# Patient Record
Sex: Female | Born: 1990 | Race: White | Hispanic: No | Marital: Married | Smoking: Never smoker
Health system: Southern US, Community
[De-identification: ages and names within clinical notes are randomized; demographics above are authoritative.]

## PROBLEM LIST (undated history)

## (undated) DIAGNOSIS — O039 Complete or unspecified spontaneous abortion without complication: Secondary | ICD-10-CM

## (undated) HISTORY — DX: Complete or unspecified spontaneous abortion without complication: O03.9

---

## 2019-05-17 DIAGNOSIS — Z8759 Personal history of other complications of pregnancy, childbirth and the puerperium: Secondary | ICD-10-CM | POA: Insufficient documentation

## 2020-12-27 ENCOUNTER — Emergency Department
Admission: EM | Admit: 2020-12-27 | Discharge: 2020-12-27 | Disposition: A | Discharge status: Home / Self Care | Payer: Managed Care, Other (non HMO) | Attending: Emergency Medicine | Admitting: Emergency Medicine

## 2020-12-27 ENCOUNTER — Emergency Department: Payer: Managed Care, Other (non HMO)

## 2020-12-27 ENCOUNTER — Encounter: Payer: Self-pay | Admitting: *Deleted

## 2020-12-27 ENCOUNTER — Other Ambulatory Visit: Payer: Self-pay

## 2020-12-27 DIAGNOSIS — O4691 Antepartum hemorrhage, unspecified, first trimester: Secondary | ICD-10-CM | POA: Insufficient documentation

## 2020-12-27 DIAGNOSIS — Z3A11 11 weeks gestation of pregnancy: Secondary | ICD-10-CM | POA: Diagnosis not present

## 2020-12-27 DIAGNOSIS — O209 Hemorrhage in early pregnancy, unspecified: Secondary | ICD-10-CM

## 2020-12-27 DIAGNOSIS — N939 Abnormal uterine and vaginal bleeding, unspecified: Secondary | ICD-10-CM

## 2020-12-27 DIAGNOSIS — O0289 Other abnormal products of conception: Secondary | ICD-10-CM

## 2020-12-27 DIAGNOSIS — O468X1 Other antepartum hemorrhage, first trimester: Secondary | ICD-10-CM

## 2020-12-27 DIAGNOSIS — O418X10 Other specified disorders of amniotic fluid and membranes, first trimester, not applicable or unspecified: Secondary | ICD-10-CM

## 2020-12-27 LAB — HCG, QUANTITATIVE, PREGNANCY: hCG, Beta Chain, Quant, S: 23511 m[IU]/mL — ABNORMAL HIGH (ref <5)

## 2020-12-27 LAB — CBC WITH DIFFERENTIAL/PLATELET
Abs Immature Granulocytes: 0.05 10*3/uL (ref 0.00–0.07)
Basophils Absolute: 0.1 10*3/uL (ref 0.0–0.1)
Basophils Relative: 1 %
Eosinophils Absolute: 0.1 10*3/uL (ref 0.0–0.5)
Eosinophils Relative: 1 %
HCT: 36.2 % (ref 36.0–46.0)
Hemoglobin: 12.4 g/dL (ref 12.0–15.0)
Immature Granulocytes: 0 %
Lymphocytes Relative: 21 %
Lymphs Abs: 2.4 10*3/uL (ref 0.7–4.0)
MCH: 30.5 pg (ref 26.0–34.0)
MCHC: 34.3 g/dL (ref 30.0–36.0)
MCV: 89.2 fL (ref 80.0–100.0)
Monocytes Absolute: 0.8 10*3/uL (ref 0.1–1.0)
Monocytes Relative: 7 %
Neutro Abs: 7.9 10*3/uL — ABNORMAL HIGH (ref 1.7–7.7)
Neutrophils Relative %: 70 %
Platelets: 282 10*3/uL (ref 150–400)
RBC: 4.06 MIL/uL (ref 3.87–5.11)
RDW: 12.8 % (ref 11.5–15.5)
WBC: 11.3 10*3/uL — ABNORMAL HIGH (ref 4.0–10.5)
nRBC: 0 % (ref 0.0–0.2)

## 2020-12-27 LAB — HEMOGLOBIN AND HEMATOCRIT, BLOOD
HCT: 35.5 % — ABNORMAL LOW (ref 36.0–46.0)
Hemoglobin: 12.4 g/dL (ref 12.0–15.0)

## 2020-12-27 LAB — BASIC METABOLIC PANEL
Anion gap: 7 (ref 5–15)
BUN: 11 mg/dL (ref 6–20)
CO2: 24 mmol/L (ref 22–32)
Calcium: 9.1 mg/dL (ref 8.9–10.3)
Chloride: 103 mmol/L (ref 98–111)
Creatinine, Ser: 0.65 mg/dL (ref 0.44–1.00)
GFR, Estimated: >60 mL/min (ref >60)
Glucose, Bld: 105 mg/dL — ABNORMAL HIGH (ref 70–99)
Potassium: 4.2 mmol/L (ref 3.5–5.1)
Sodium: 134 mmol/L — ABNORMAL LOW (ref 135–145)

## 2020-12-27 LAB — URINALYSIS, COMPLETE (UACMP) WITH MICROSCOPIC
Bilirubin Urine: NEGATIVE
Glucose, UA: NEGATIVE mg/dL
Ketones, ur: 5 mg/dL — AB
Nitrite: NEGATIVE
Protein, ur: 30 mg/dL — AB
RBC / HPF: >50 RBC/hpf — ABNORMAL HIGH (ref 0–5)
Specific Gravity, Urine: 1.008 (ref 1.005–1.030)
WBC, UA: >50 WBC/hpf — ABNORMAL HIGH (ref 0–5)
pH: 6 (ref 5.0–8.0)

## 2020-12-27 LAB — WET PREP, GENITAL
Clue Cells Wet Prep HPF POC: NONE SEEN
Sperm: NONE SEEN
Trich, Wet Prep: NONE SEEN
Yeast Wet Prep HPF POC: NONE SEEN

## 2020-12-27 NOTE — ED Provider Notes (Signed)
[facility] Emergency Department Provider Note  ____________________________________________   Event Date/Time   First MD Initiated Contact with Patient 12/27/20 1951     (approximate)  I have reviewed the triage vital signs and the nursing notes.   HISTORY  Chief Complaint Vaginal Bleeding   HPI Michelle Holden is a 30 y.o. female G2, P1 at approximately 11 weeks by last LMP who presents with concerns that she is having a miscarriage after being told she likely had a nonviable pregnancy at her last OB visit on 10/28.  She states that 5 days ago she developed some vaginal bleeding that seems to have picked up today.  She has been going through several pads.  She states she has had some generalized crampy abdominal pain last couple days as well but is not very severe right now and does not wish for any analgesia.  She denies any back pain, trauma, injuries, burning with urination, other abnormal vaginal discharge, chest pain, cough, shortness of breath, fevers, headache, earache, sore throat, lightheadedness or dizziness or any other acute sick symptoms.  States only medication she had been on was prenatal vitamins.  She states she had ultrasound done on 10/28 at her last visit that showed fetal size was very small and they were told likely nonviable.         No past medical history on file.  There are no problems to display for this patient.     Prior to Admission medications   Not on File    Allergies Patient has no known allergies.  No family history on file.  Social History Social History   Tobacco Use   Smoking status: Never   Smokeless tobacco: Never  Substance Use Topics   Alcohol use: Not Currently    Review of Systems  Review of Systems  Constitutional:  Negative for chills and fever.  HENT:  Negative for sore throat.   Eyes:  Negative for pain.  Respiratory:  Negative for cough and stridor.   Cardiovascular:  Negative for  chest pain.  Gastrointestinal:  Positive for abdominal pain. Negative for vomiting.  Genitourinary:  Negative for dysuria.  Musculoskeletal:  Negative for myalgias.  Skin:  Negative for rash.  Neurological:  Negative for seizures, loss of consciousness and headaches.  Psychiatric/Behavioral:  Negative for suicidal ideas.   All other systems reviewed and are negative.    ____________________________________________   PHYSICAL EXAM:  VITAL SIGNS: ED Triage Vitals  Enc Vitals Group     BP 12/27/20 1852 (!) 137/91     Pulse Rate 12/27/20 1852 81     Resp 12/27/20 1852 20     Temp 12/27/20 1852 99.3 F (37.4 C)     Temp Source 12/27/20 1852 Oral     SpO2 12/27/20 1852 100 %     Weight 12/27/20 1848 173 lb (78.5 kg)     Height 12/27/20 1848 5\' 6"  (1.676 m)     Head Circumference --      Peak Flow --      Pain Score 12/27/20 1847 0     Pain Loc --      Pain Edu? --      Excl. in GC? --    Vitals:   12/27/20 1852  BP: (!) 137/91  Pulse: 81  Resp: 20  Temp: 99.3 F (37.4 C)  SpO2: 100%   Physical Exam Vitals and nursing note reviewed.  Constitutional:      General: She is  not in acute distress.    Appearance: She is well-developed.  HENT:     Head: Normocephalic and atraumatic.     Right Ear: External ear normal.     Left Ear: External ear normal.     Nose: Nose normal.  Eyes:     Conjunctiva/sclera: Conjunctivae normal.  Cardiovascular:     Rate and Rhythm: Normal rate and regular rhythm.     Heart sounds: No murmur heard. Pulmonary:     Effort: Pulmonary effort is normal. No respiratory distress.     Breath sounds: Normal breath sounds.  Abdominal:     Palpations: Abdomen is soft.     Tenderness: There is no abdominal tenderness.  Musculoskeletal:     Cervical back: Neck supple.  Skin:    General: Skin is warm and dry.     Capillary Refill: Capillary refill takes less than 2 seconds.  Neurological:     Mental Status: She is alert and oriented to person,  place, and time.  Psychiatric:        Mood and Affect: Mood normal.    Pelvic exam shows slightly edematous closed cervix with some minimal active oozing of red blood.  There is no friability or purulence. ____________________________________________   LABS (all labs ordered are listed, but only abnormal results are displayed)  Labs Reviewed  CBC WITH DIFFERENTIAL/PLATELET - Abnormal; Notable for the following components:      Result Value   WBC 11.3 (*)    Neutro Abs 7.9 (*)    All other components within normal limits  BASIC METABOLIC PANEL - Abnormal; Notable for the following components:   Sodium 134 (*)    Glucose, Bld 105 (*)    All other components within normal limits  HCG, QUANTITATIVE, PREGNANCY - Abnormal; Notable for the following components:   hCG, Beta Chain, Quant, S 23,511 (*)    All other components within normal limits  WET PREP, GENITAL  CHLAMYDIA/NGC RT PCR ([facility] ONLY)            HEMOGLOBIN AND HEMATOCRIT, BLOOD  URINALYSIS, COMPLETE (UACMP) WITH MICROSCOPIC   ____________________________________________  EKG  ____________________________________________  RADIOLOGY  ED MD interpretation: Pelvic OB ultrasound shows an IUP without cardiac activity measuring 6 weeks by size.  Findings are most consistent with likely failed but not definitive early pregnancy.  There is no evidence of hemorrhage, free fluid on the left ovary is not completely visualized right ovary is unremarkable.  There is a very small subchronic hemorrhage.  No other acute abnormalities noted.  Official radiology report(s): US OB LESS THAN 14 WEEKS WITH OB TRANSVAGINAL  Result Date: 12/27/2020 CLINICAL DATA:  Pregnant, vaginal bleeding EXAM: OBSTETRIC <14 WK [place] AND TRANSVAGINAL OB US TECHNIQUE: Both transabdominal and transvaginal ultrasound examinations were performed for complete evaluation of the gestation as well as the maternal uterus, adnexal regions, and pelvic cul-de-sac.  Transvaginal technique was performed to assess early pregnancy. COMPARISON:  None. FINDINGS: Intrauterine gestational sac: Single, mildly irregular with angular margins Yolk sac:  Not Visualized. Embryo:  Visualized. Cardiac Activity: Not Visualized. CRL:  3.6 mm   6 w   0 d Subchorionic hemorrhage:  Small subchronic hemorrhage. Maternal uterus/adnexae: Right ovary is within normal limits, noting a corpus luteum. Left ovary is not discretely visualized. No free fluid. IMPRESSION: Single IUP without cardiac activity, measuring 6 weeks 0 days by crown-rump length. Findings are suspicious but not yet definitive for failed pregnancy. Recommend follow-up US in 10-14 days for definitive diagnosis.  This recommendation follows SRU consensus guidelines: Diagnostic Criteria for Nonviable Pregnancy Early in the First Trimester. Alta Corning Med 2013WM:[id number]. Electronically Signed   By: Julian Hy M.D.   On: 12/27/2020 21:23    ____________________________________________   PROCEDURES  Procedure(s) performed (including Critical Care):  Procedures   ____________________________________________   INITIAL IMPRESSION / ASSESSMENT AND PLAN / ED COURSE      Patient presents with above-stated history and exam for assessment of approximately 5 days of some crampy abdominal pain associate with vaginal bleeding that she feels picked up a little more today.  This is in the setting of recent being told she likely had a nonviable pregnancy and patient being under impression that she is having a miscarriage.  She states she told the on-call nurse that her bleeding was little more today and was told to come to the emergency room.  She states he has minimal pain on arrival and does not wish for any pain medicines.  She is afebrile and hemodynamically stable on arrival.  Her abdomen is soft nontender throughout and she has no CVA tenderness.  It is certainly possible she has an incomplete abortion although given she  has not had any imaging since her bleeding began additional differential considerations include possible subchorionic hemorrhage.  She has no fever or significant pain and does not appear toxic and have a lower suspicion for acute infectious process i.e. endometritis.  CBC shows very mild leukocytosis with WBC count of 11.3 without any other acute derangements.  BMP shows no significant electrolyte or metabolic derangements.  hCG is elevated at 23,511.  Pelvic OB ultrasound shows an IUP without cardiac activity measuring 6 weeks by size.  Findings are most consistent with likely failed but not definitive early pregnancy.  There is no evidence of hemorrhage, free fluid on the left ovary is not completely visualized right ovary is unremarkable.  There is a very small subchronic hemorrhage.  No other acute abnormalities noted.  Exam shows a closed os with some oozing without any other trauma or evidence of infection.  Suspect likely threatened versus early miscarriage from likely nonviable pregnancy.  I discussed this with patient.  We will plan to check hemoglobin again and if this is stable given stable vitals with eyes reassuring exam I think she will be stable for discharge with close outpatient follow-up.  If there is a significant drop greater than 2 g of her hemoglobin I think will be reasonable to have her admitted for more urgent D&C.  I discussed this plan with on-call obstetrician Dr. Kenton Kingfisher who agreed with this.  Care patient signed over to assuming provider at approximately 10 PM with follow-up to follow-up repeat hemoglobin and reassess.  Patient does not appear actively infected wet prep and GC's were sent from pelvic exam.  Do not think patient needs to remain in the emergency room she is otherwise stable for discharge for pending GC.      ____________________________________________   FINAL CLINICAL IMPRESSION(S) / ED DIAGNOSES  Final diagnoses:  Subchorionic hemorrhage of placenta  in first trimester, single or unspecified fetus  First trimester bleeding  Non-viable pregnancy    Medications - No data to display   ED Discharge Orders     None        Note:  This document was prepared using Dragon voice recognition software and may include unintentional dictation errors.    Lucrezia Starch, MD 12/27/20 2154

## 2020-12-27 NOTE — Discharge Instructions (Addendum)
Your Ultrasound today showed: FINDINGS: Intrauterine gestational sac: Single, mildly irregular with angular margins   Yolk sac:  Not Visualized.   Embryo:  Visualized.   Cardiac Activity: Not Visualized.   CRL:  3.6 mm   6 w   0 d   Subchorionic hemorrhage:  Small subchronic hemorrhage.   Maternal uterus/adnexae: Right ovary is within normal limits, noting a corpus luteum.   Left ovary is not discretely visualized.   No free fluid.   IMPRESSION: Single IUP without cardiac activity, measuring 6 weeks 0 days by crown-rump length.   Findings are suspicious but not yet definitive for failed pregnancy. Recommend follow-up US in 10-14 days for definitive diagnosis. This recommendation follows SRU consensus guidelines: Diagnostic Criteria for Nonviable Pregnancy Early in the First Trimester. Malva Limes Med 2013; 887:1959-74.

## 2020-12-27 NOTE — ED Notes (Signed)
Pt unable to void at this time. 

## 2020-12-27 NOTE — ED Triage Notes (Signed)
Pt reports vag bleeding for 5 days.  Pt is approx [redacted] weeks pregnant.  Pt treated at [facility].  Pt has low back pain.   Pt alert  speech clear.  g2p1a0

## 2020-12-28 ENCOUNTER — Encounter: Payer: Self-pay | Admitting: Emergency Medicine

## 2020-12-28 ENCOUNTER — Emergency Department: Payer: Managed Care, Other (non HMO)

## 2020-12-28 ENCOUNTER — Ambulatory Visit
Admission: EM | Admit: 2020-12-28 | Discharge: 2020-12-29 | Disposition: A | Discharge status: Home / Self Care | Payer: Managed Care, Other (non HMO) | Attending: Emergency Medicine | Admitting: Emergency Medicine

## 2020-12-28 DIAGNOSIS — O418X10 Other specified disorders of amniotic fluid and membranes, first trimester, not applicable or unspecified: Secondary | ICD-10-CM | POA: Diagnosis present

## 2020-12-28 DIAGNOSIS — N939 Abnormal uterine and vaginal bleeding, unspecified: Secondary | ICD-10-CM | POA: Diagnosis present

## 2020-12-28 DIAGNOSIS — O021 Missed abortion: Secondary | ICD-10-CM

## 2020-12-28 DIAGNOSIS — Z3A11 11 weeks gestation of pregnancy: Secondary | ICD-10-CM | POA: Diagnosis not present

## 2020-12-28 DIAGNOSIS — Z20822 Contact with and (suspected) exposure to covid-19: Secondary | ICD-10-CM | POA: Diagnosis not present

## 2020-12-28 DIAGNOSIS — O468X1 Other antepartum hemorrhage, first trimester: Secondary | ICD-10-CM

## 2020-12-28 LAB — CBC WITH DIFFERENTIAL/PLATELET
Abs Immature Granulocytes: 0.04 10*3/uL (ref 0.00–0.07)
Basophils Absolute: 0.1 10*3/uL (ref 0.0–0.1)
Basophils Relative: 1 %
Eosinophils Absolute: 0.1 10*3/uL (ref 0.0–0.5)
Eosinophils Relative: 1 %
HCT: 34.3 % — ABNORMAL LOW (ref 36.0–46.0)
Hemoglobin: 11.7 g/dL — ABNORMAL LOW (ref 12.0–15.0)
Immature Granulocytes: 0 %
Lymphocytes Relative: 19 %
Lymphs Abs: 1.9 10*3/uL (ref 0.7–4.0)
MCH: 30.5 pg (ref 26.0–34.0)
MCHC: 34.1 g/dL (ref 30.0–36.0)
MCV: 89.6 fL (ref 80.0–100.0)
Monocytes Absolute: 0.6 10*3/uL (ref 0.1–1.0)
Monocytes Relative: 6 %
Neutro Abs: 7.4 10*3/uL (ref 1.7–7.7)
Neutrophils Relative %: 73 %
Platelets: 262 10*3/uL (ref 150–400)
RBC: 3.83 MIL/uL — ABNORMAL LOW (ref 3.87–5.11)
RDW: 12.9 % (ref 11.5–15.5)
WBC: 10 10*3/uL (ref 4.0–10.5)
nRBC: 0 % (ref 0.0–0.2)

## 2020-12-28 LAB — COMPREHENSIVE METABOLIC PANEL
ALT: 14 U/L (ref 0–44)
AST: 17 U/L (ref 15–41)
Albumin: 4 g/dL (ref 3.5–5.0)
Alkaline Phosphatase: 48 U/L (ref 38–126)
Anion gap: 6 (ref 5–15)
BUN: 8 mg/dL (ref 6–20)
CO2: 23 mmol/L (ref 22–32)
Calcium: 8.7 mg/dL — ABNORMAL LOW (ref 8.9–10.3)
Chloride: 106 mmol/L (ref 98–111)
Creatinine, Ser: 0.58 mg/dL (ref 0.44–1.00)
GFR, Estimated: >60 mL/min (ref >60)
Glucose, Bld: 107 mg/dL — ABNORMAL HIGH (ref 70–99)
Potassium: 3.9 mmol/L (ref 3.5–5.1)
Sodium: 135 mmol/L (ref 135–145)
Total Bilirubin: 0.8 mg/dL (ref 0.3–1.2)
Total Protein: 6.8 g/dL (ref 6.5–8.1)

## 2020-12-28 LAB — HCG, QUANTITATIVE, PREGNANCY: hCG, Beta Chain, Quant, S: 18365 m[IU]/mL — ABNORMAL HIGH (ref <5)

## 2020-12-28 LAB — CHLAMYDIA/NGC RT PCR (ARMC ONLY)
Chlamydia Tr: NOT DETECTED
N gonorrhoeae: NOT DETECTED

## 2020-12-28 NOTE — H&P (Signed)
Obstetrics & Gynecology Consult H&P   Chief Complaints: Bleeding   History of Present Illness: Patient is a 30 y.o. G2P1001 at 11 weeks 2 days by last menstrual period of 10/11/2020 (exact date) presenting for second time to ER in past 24-hrs with bleeding and known IUP.  Bleeding and cramping has increased prompting re-presentation.  H&H remains stable.  On initial presentation 12/27/2020 CRL <7.32mm with no FHT so inconclusive on viability.  Today there is a drop in the HCG level from 23,539mIU/mL to 18,667mIU/mL, fetal pole is no longer visualized on ultrasound, and the gestational sac is elongated and has moved into the lower uterine segement.     Review of Systems:10 point review of systems  Past Medical History:  There are no problems to display for this patient.   Past Surgical History:  History reviewed. No pertinent surgical history.  Gynecologic History:   Obstetric History: G1P0  Family History:  No family history on file.  Social History:  Social History   Socioeconomic History   Marital status: Married    Spouse name: Not on file   Number of children: Not on file   Years of education: Not on file   Highest education level: Not on file  Occupational History   Not on file  Tobacco Use   Smoking status: Never   Smokeless tobacco: Never  Substance and Sexual Activity   Alcohol use: Not Currently   Drug use: Not on file   Sexual activity: Not on file  Other Topics Concern   Not on file  Social History Narrative   Not on file   Social Determinants of Health   Financial Resource Strain: Not on file  Food Insecurity: Not on file  Transportation Needs: Not on file  Physical Activity: Not on file  Stress: Not on file  Social Connections: Not on file  Intimate Partner Violence: Not on file    Allergies:  No Known Allergies  Medications: Prior to Admission medications   Not on File    Physical Exam Vitals: Blood pressure 123/83, pulse 76,  temperature 98.2 F (36.8 C), temperature source Oral, resp. rate 18, height 5\' 6"  (1.676 m), weight 78.5 kg, last menstrual period 10/11/2020, SpO2 100 %. General: NAD HEENT: normocephalic, anicteric Pulmonary: No increased work of breathing Cardiovascular: RRR, distal pulses 2+ Abdomen: soft, non-tender Genitourinary: deferred given just had ultrasound prior to me coming down to talk to patient Extremities: no edema, erythema, or tenderness Neurologic: Grossly intact Psychiatric: mood appropriate, affect full  Labs: Results for orders placed or performed during the hospital encounter of 12/28/20 (from the past 72 hour(s))  CBC with Differential     Status: Abnormal   Collection Time: 12/28/20  7:28 PM  Result Value Ref Range   WBC 10.0 4.0 - 10.5 K/uL   RBC 3.83 (L) 3.87 - 5.11 MIL/uL   Hemoglobin 11.7 (L) 12.0 - 15.0 g/dL   HCT 68.1 (L) 15.7 - 26.2 %   MCV 89.6 80.0 - 100.0 fL   MCH 30.5 26.0 - 34.0 pg   MCHC 34.1 30.0 - 36.0 g/dL   RDW 03.5 59.7 - 41.6 %   Platelets 262 150 - 400 K/uL   nRBC 0.0 0.0 - 0.2 %   Neutrophils Relative % 73 %   Neutro Abs 7.4 1.7 - 7.7 K/uL   Lymphocytes Relative 19 %   Lymphs Abs 1.9 0.7 - 4.0 K/uL   Monocytes Relative 6 %   Monocytes Absolute 0.6 0.1 -  1.0 K/uL   Eosinophils Relative 1 %   Eosinophils Absolute 0.1 0.0 - 0.5 K/uL   Basophils Relative 1 %   Basophils Absolute 0.1 0.0 - 0.1 K/uL   Immature Granulocytes 0 %   Abs Immature Granulocytes 0.04 0.00 - 0.07 K/uL    Comment: Performed at [facility], [address] [place], [place] [address]  Comprehensive metabolic panel     Status: Abnormal   Collection Time: 12/28/20  7:28 PM  Result Value Ref Range   Sodium 135 135 - 145 mmol/L   Potassium 3.9 3.5 - 5.1 mmol/L   Chloride 106 98 - 111 mmol/L   CO2 23 22 - 32 mmol/L   Glucose, Bld 107 (H) 70 - 99 mg/dL    Comment: Glucose reference range applies only to samples taken after fasting for at least 8 hours.   BUN 8 6 -  20 mg/dL   Creatinine, Ser 4.68 0.44 - 1.00 mg/dL   Calcium 8.7 (L) 8.9 - 10.3 mg/dL   Total Protein 6.8 6.5 - 8.1 g/dL   Albumin 4.0 3.5 - 5.0 g/dL   AST 17 15 - 41 U/L   ALT 14 0 - 44 U/L   Alkaline Phosphatase 48 38 - 126 U/L   Total Bilirubin 0.8 0.3 - 1.2 mg/dL   GFR, Estimated >03 >21 mL/min    Comment: (NOTE) Calculated using the CKD-EPI Creatinine Equation (2021)    Anion gap 6 5 - 15    Comment: Performed at [facility], [address] [place], [place] [address]  hCG, quantitative, pregnancy     Status: Abnormal   Collection Time: 12/28/20  7:28 PM  Result Value Ref Range   hCG, Beta Chain, Quant, S 18,365 (H) <5 mIU/mL    Comment:          GEST. AGE      CONC.  (mIU/mL)   <=1 WEEK        5 - 50     2 WEEKS       50 - 500     3 WEEKS       100 - 10,000     4 WEEKS     1,000 - 30,000     5 WEEKS     3,500 - 115,000   6-8 WEEKS     12,000 - 270,000    12 WEEKS     15,000 - 220,000        FEMALE AND NON-PREGNANT FEMALE:     LESS THAN 5 mIU/mL Performed at [facility], [address] [place], [place] [address]     Imaging US OB LESS THAN 14 WEEKS WITH OB TRANSVAGINAL  Result Date: 12/28/2020 CLINICAL DATA:  Vaginal bleeding EXAM: OBSTETRIC <14 WK [place] AND TRANSVAGINAL OB US TECHNIQUE: Both transabdominal and transvaginal ultrasound examinations were performed for complete evaluation of the gestation as well as the maternal uterus, adnexal regions, and pelvic cul-de-sac. Transvaginal technique was performed to assess early pregnancy. COMPARISON:  12/27/2020 FINDINGS: Intrauterine gestational sac: Single intrauterine gestational sac now visualized within the lower uterine segment and upper cervix. Yolk sac:  Not visualized Embryo:  Not visualized Cardiac Activity: Not visualized MSD: 17.6 mm mm   6 w   5 d Maternal uterus/adnexae: Ovaries are within normal limits. The left ovary measures 2 x 1 by 1.3 cm. The right ovary measures 3.2 x 10.2 x 4.4 cm.  Moderate complex material within the fundal endometrial canal with  particulate motion. Endometrial thickness measures 2.5 cm and there is increased vascularity. Trace free fluid IMPRESSION: 1. Elongated gestational sac now visualized within the lower uterine segment/upper cervix, consistent with miscarriage in process. Moderate complex material in the fundal endometrium probably represents hemorrhagic material but follow-up sonography may be warranted if continued concern for retained products. 2. Trace free fluid in the pelvis Electronically Signed   By: Jasmine Pang M.D.   On: 12/28/2020 23:21   US OB LESS THAN 14 WEEKS WITH OB TRANSVAGINAL  Result Date: 12/27/2020 CLINICAL DATA:  Pregnant, vaginal bleeding EXAM: OBSTETRIC <14 WK [place] AND TRANSVAGINAL OB US TECHNIQUE: Both transabdominal and transvaginal ultrasound examinations were performed for complete evaluation of the gestation as well as the maternal uterus, adnexal regions, and pelvic cul-de-sac. Transvaginal technique was performed to assess early pregnancy. COMPARISON:  None. FINDINGS: Intrauterine gestational sac: Single, mildly irregular with angular margins Yolk sac:  Not Visualized. Embryo:  Visualized. Cardiac Activity: Not Visualized. CRL:  3.6 mm   6 w   0 d Subchorionic hemorrhage:  Small subchronic hemorrhage. Maternal uterus/adnexae: Right ovary is within normal limits, noting a corpus luteum. Left ovary is not discretely visualized. No free fluid. IMPRESSION: Single IUP without cardiac activity, measuring 6 weeks 0 days by crown-rump length. Findings are suspicious but not yet definitive for failed pregnancy. Recommend follow-up US in 10-14 days for definitive diagnosis. This recommendation follows SRU consensus guidelines: Diagnostic Criteria for Nonviable Pregnancy Early in the First Trimester. Malva Limes Med 2013; 166:0630-16. Electronically Signed   By: Charline Bills M.D.   On: 12/27/2020 21:23    Assessment: 30 y.o. G1P0 with 6 week  missed abortion  Plan:   Condolences were offered to the patient and her family.  I stressed that while emotionally difficult, that this did not occur because of an actions or inactions by the patient.  Somewhere between 10-20% of identified first trimester pregnancies will unfortunately end in miscarriage.  Given this relatively high incidence rate, further diagnostic testing such as chromosome analysis is generally not clinically relevant nor recommended.  Although the chromosomal abnormalities have been implicated at rates as high as 70% in some studies, these are generally random and do not infer and increased risk of recurrence with subsequent pregnancies.  However, 3 or more consecutive first trimester losses are relatively uncommon, and these patient generally do benefit from additional work up to determine a potential modifiable etiology.   We briefly discussed management options including expectant management, medical management, and surgical management as well as their relative success rates and complications. Approximately 80% of first trimester miscarriages will pass successfully but may require a time frame of up to 8 weeks (ACOG Practice Bulletin 150 May 2015 "Early Pregnancy Loss").  Medical management using of misoprostil administered every 3-hrs as needed for up to 3 doses speeds up the time frame to completion significantly, has literature supporting its use up to 63 days or [redacted]w[redacted]d gestation and results in a passage rate of 84-85% (ACOG Practice Bulletin 143 March 2014 "Medical Management of First-Trimester Abortion").  Dilation and curettage has the highest rate of uterine evacuation, but carries with is operative cost, surgical and anesthetic risk.  While these risk are relatively small they nevertheless include infection, bleeding, uterine perforation, formation of uterine synechia, and in rare cases death.   We discussed repeat ultrasound and or trending HCG levels if the patient  wishes to pursue these prior to making her decision.  Clinically I am confident of  the diagnosis, but I do not want any doubts in the patient's mind regarding the plan of management she chooses to adopt.  I will allow the patient and her family to discuss management options and she was advised to contact the office to arrange final disposition one she has made her decision or should she have any follow up questions for myself.    The patient is A positive per prior [facility] records and rhogam is therefore not indicated.   - patient opts to proceed with D&C for management    Vena Austria, MD, [facility], [facility] 12/28/2020, 11:59 PM

## 2020-12-28 NOTE — ED Notes (Signed)
Pt. In US.

## 2020-12-28 NOTE — Discharge Instructions (Addendum)
AMBULATORY SURGERY  DISCHARGE INSTRUCTIONS   The drugs that you were given will stay in your system until tomorrow so for the next 24 hours you should not:  Drive an automobile Make any legal decisions Drink any alcoholic beverage   You may resume regular meals tomorrow.  Today it is better to start with liquids and gradually work up to solid foods.  You may eat anything you prefer, but it is better to start with liquids, then soup and crackers, and gradually work up to solid foods.   Please notify your doctor immediately if you have any unusual bleeding, trouble breathing, redness and pain at the surgery site, drainage, fever, or pain not relieved by medication.       Please contact your physician with any problems or Same Day Surgery at [phone], Monday through Friday 6 am to 4 pm, or [place] [facility] [place] Main number at [phone].  

## 2020-12-28 NOTE — ED Notes (Signed)
Pt given brief and assisted back in bed. Pt passing large amount of blood noted in toilet.

## 2020-12-28 NOTE — ED Triage Notes (Signed)
Patient to ED for vaginal bleeding. Patient was seen here last night for same and told she was having a miscarriage. Patient states that bleeding is now worse and the clots are bigger. Patient ambulatory to triage.

## 2020-12-28 NOTE — ED Provider Notes (Signed)
[facility] Emergency Department Provider Note  ____________________________________________  Time seen: Approximately 8:11 PM  I have reviewed the triage vital signs and the nursing notes.   HISTORY  Chief Complaint Vaginal Bleeding    HPI Michelle Holden is a 30 y.o. female G2 P1 who comes ED complaining of vaginal bleeding and pelvic cramping pain that is been ongoing for the past 6 days.  She was seen in the ED yesterday, had ultrasound and labs which showed stable hemoglobin, ultrasound showed subchorionic hemorrhage but not definitive for failed pregnancy by radiology criteria.  She returns today due to worsening pain, increased bleeding and passage of clots.  Denies fever chills chest pain shortness of breath.  Labs yesterday were negative for signs of STI or UTI.    History reviewed. No pertinent past medical history.   Patient Active Problem List   Diagnosis Date Noted   Missed abortion      Past Surgical History:  Procedure Laterality Date   DILATION AND EVACUATION N/A 12/29/2020   Procedure: DILATATION AND EVACUATION;  Surgeon: Vena Austria, MD;  Location: [facility] ORS;  Service: Gynecology;  Laterality: N/A;     Prior to Admission medications   Medication Sig Start Date End Date Taking? Authorizing Provider  HYDROcodone-acetaminophen (NORCO/VICODIN) 5-325 MG tablet Take 1 tablet by mouth every 4 (four) hours as needed for moderate pain or severe pain. 12/29/20  Yes Vena Austria, MD  ibuprofen (ADVIL) 600 MG tablet Take 1 tablet (600 mg total) by mouth every 6 (six) hours as needed for cramping or mild pain. 12/29/20  Yes Vena Austria, MD     Allergies Patient has no known allergies.   No family history on file.  Social History Social History   Tobacco Use   Smoking status: Never   Smokeless tobacco: Never  Substance Use Topics   Alcohol use: Not Currently    Review of Systems  Constitutional:   No fever or  chills.  ENT:   No sore throat. No rhinorrhea. Cardiovascular:   No chest pain or syncope. Respiratory:   No dyspnea or cough. Gastrointestinal:   Negative for abdominal pain, vomiting and diarrhea.  Musculoskeletal:   Negative for focal pain or swelling All other systems reviewed and are negative except as documented above in ROS and HPI.  ____________________________________________   PHYSICAL EXAM:  VITAL SIGNS: ED Triage Vitals  Enc Vitals Group     BP 12/28/20 1713 140/88     Pulse Rate 12/28/20 1713 79     Resp 12/28/20 1713 18     Temp 12/28/20 1713 98.2 F (36.8 C)     Temp Source 12/28/20 1713 Oral     SpO2 12/28/20 1713 99 %     Weight 12/28/20 1714 173 lb (78.5 kg)     Height 12/28/20 1714 5\' 6"  (1.676 m)     Head Circumference --      Peak Flow --      Pain Score 12/28/20 1713 6     Pain Loc --      Pain Edu? --      Excl. in GC? --     Vital signs reviewed, nursing assessments reviewed.   Constitutional:   Alert and oriented. Non-toxic appearance. Eyes:   Conjunctivae are normal. EOMI. PERRL. ENT      Head:   Normocephalic and atraumatic.      Nose:   Wearing a mask.      Mouth/Throat:   Wearing a mask.  Neck:   No meningismus. Full ROM. Hematological/Lymphatic/Immunilogical:   No cervical lymphadenopathy. Cardiovascular:   RRR. Symmetric bilateral radial and DP pulses.  No murmurs. Cap refill less than 2 seconds. Respiratory:   Normal respiratory effort without tachypnea/retractions. Breath sounds are clear and equal bilaterally. No wheezes/rales/rhonchi. Gastrointestinal:   Soft and nontender. Non distended. There is no CVA tenderness.  No rebound, rigidity, or guarding. Genitourinary:   deferred Musculoskeletal:   Normal range of motion in all extremities. No joint effusions.  No lower extremity tenderness.  No edema. Neurologic:   Normal speech and language.  Motor grossly intact. No acute focal neurologic deficits are appreciated.  Skin:     Skin is warm, dry and intact. No rash noted.  No petechiae, purpura, or bullae.  ____________________________________________    LABS (pertinent positives/negatives) (all labs ordered are listed, but only abnormal results are displayed) Labs Reviewed  CBC WITH DIFFERENTIAL/PLATELET - Abnormal; Notable for the following components:      Result Value   RBC 3.83 (*)    Hemoglobin 11.7 (*)    HCT 34.3 (*)    All other components within normal limits  COMPREHENSIVE METABOLIC PANEL - Abnormal; Notable for the following components:   Glucose, Bld 107 (*)    Calcium 8.7 (*)    All other components within normal limits  HCG, QUANTITATIVE, PREGNANCY - Abnormal; Notable for the following components:   hCG, Beta Chain, Quant, S 18,365 (*)    All other components within normal limits  RESP PANEL BY RT-PCR (FLU A&B, COVID) ARPGX2  TYPE AND SCREEN  TYPE AND SCREEN  ABO/RH  SURGICAL PATHOLOGY   ____________________________________________   EKG    ____________________________________________    RADIOLOGY  No results found.  ____________________________________________   PROCEDURES Procedures  ____________________________________________    CLINICAL IMPRESSION / ASSESSMENT AND PLAN / ED COURSE  Medications ordered in the ED: Medications  doxycycline (VIBRAMYCIN) 100 mg in sodium chloride 0.9 % 250 mL IVPB (100 mg Intravenous New Bag/Given 12/29/20 0118)    Pertinent labs & imaging results that were available during my care of the patient were reviewed by me and considered in my medical decision making (see chart for details).  Michelle Holden was evaluated in Emergency Department on 12/30/2020 for the symptoms described in the history of present illness. She was evaluated in the context of the global COVID-19 pandemic, which necessitated consideration that the patient might be at risk for infection with the SARS-CoV-2 virus that causes COVID-19. Institutional protocols and  algorithms that pertain to the evaluation of patients at risk for COVID-19 are in a state of rapid change based on information released by regulatory bodies including the CDC and federal and state organizations. These policies and algorithms were followed during the patient's care in the ED.   Patient presents with persistent vaginal bleeding, first semester pregnancy.  Reviewed EMR, pregnancy yesterday showing gestational size compatible with a 6-week pregnancy, LMP was 11 weeks ago.  Discussed findings with gynecology Dr. Bonney Aid who advises that since she does not meet the strict criteria of failed pregnancy, this can only be considered subchorionic hemorrhage at this time with potentially viable early pregnancy and would therefore not recommend any abortive measures unless she has a brisk drop in hemoglobin or following hCG to corroborate a failed pregnancy.  Clinical Course as of 12/30/20 1529  Thu Dec 28, 2020  2333 Ultrasound shows retained POC's and intrauterine hematoma.  Dr. Bonney Aid will evaluate for possible D&C. [PS]  Clinical Course User Index [PS] Sharman Cheek, MD     ____________________________________________   FINAL CLINICAL IMPRESSION(S) / ED DIAGNOSES    Final diagnoses:  Subchorionic hemorrhage of placenta in first trimester, single or unspecified fetus  Vaginal bleeding  Retained products of conception with hemorrhage     ED Discharge Orders          Ordered    Call MD for:       Comments: For heavy vaginal bleeding greater than 1 pad an hour   12/29/20 0150    Call MD for:  temperature >100.4        12/29/20 0150    Call MD for:  persistant nausea and vomiting        12/29/20 0150    Call MD for:  severe uncontrolled pain        12/29/20 0150    Call MD for:  difficulty breathing, headache or visual disturbances        12/29/20 0150    Call MD for:  hives        12/29/20 0150    Call MD for:  persistant dizziness or light-headedness         12/29/20 0150    Call MD for:  extreme fatigue        12/29/20 0150    Activity as tolerated        12/29/20 0150    Diet general        12/29/20 0150    HYDROcodone-acetaminophen (NORCO/VICODIN) 5-325 MG tablet  Every 4 hours PRN        12/29/20 0150    ibuprofen (ADVIL) 600 MG tablet  Every 6 hours PRN        12/29/20 0150            Portions of this note were generated with dragon dictation software. Dictation errors may occur despite best attempts at proofreading.    Sharman Cheek, MD 12/30/20 [phone]

## 2020-12-28 NOTE — ED Notes (Addendum)
Pt unhooked and assisted to the bathroom. Pt with steady gait

## 2020-12-29 ENCOUNTER — Encounter
Admission: EM | Disposition: A | Discharge status: Home / Self Care | Payer: Self-pay | Source: Home / Self Care | Attending: Emergency Medicine

## 2020-12-29 ENCOUNTER — Emergency Department: Payer: Managed Care, Other (non HMO) | Admitting: Registered Nurse

## 2020-12-29 DIAGNOSIS — O021 Missed abortion: Secondary | ICD-10-CM

## 2020-12-29 HISTORY — PX: DILATION AND EVACUATION: SHX1459

## 2020-12-29 LAB — TYPE AND SCREEN
ABO/RH(D): A POS
Antibody Screen: NEGATIVE

## 2020-12-29 LAB — RESP PANEL BY RT-PCR (FLU A&B, COVID) ARPGX2
Influenza A by PCR: NEGATIVE
Influenza B by PCR: NEGATIVE
SARS Coronavirus 2 by RT PCR: NEGATIVE

## 2020-12-29 LAB — ABO/RH: ABO/RH(D): A POS

## 2020-12-29 SURGERY — DILATION AND EVACUATION, UTERUS
Anesthesia: General

## 2020-12-29 MED ORDER — SODIUM CHLORIDE 0.9 % IV SOLN
100.0000 mg | Freq: Once | INTRAVENOUS | Status: AC
  Administered 2020-12-29: 100 mg via INTRAVENOUS
  Filled 2020-12-29: qty 100

## 2020-12-29 MED ORDER — DEXMEDETOMIDINE (PRECEDEX) IN NS 20 MCG/5ML (4 MCG/ML) IV SYRINGE
PREFILLED_SYRINGE | INTRAVENOUS | Status: AC
  Filled 2020-12-29: qty 5

## 2020-12-29 MED ORDER — FENTANYL CITRATE (PF) 100 MCG/2ML IJ SOLN: 25.0000 ug | INTRAMUSCULAR | Status: DC | PRN

## 2020-12-29 MED ORDER — ONDANSETRON HCL 4 MG/2ML IJ SOLN: 4.0000 mg | Freq: Once | INTRAMUSCULAR | Status: DC | PRN

## 2020-12-29 MED ORDER — ACETAMINOPHEN 10 MG/ML IV SOLN: 1000.0000 mg | Freq: Once | INTRAVENOUS | Status: DC | PRN

## 2020-12-29 MED ORDER — LIDOCAINE HCL (CARDIAC) PF 100 MG/5ML IV SOSY
PREFILLED_SYRINGE | INTRAVENOUS | Status: DC | PRN
  Administered 2020-12-29: 40 mg via INTRAVENOUS

## 2020-12-29 MED ORDER — DEXAMETHASONE SODIUM PHOSPHATE 10 MG/ML IJ SOLN
INTRAMUSCULAR | Status: AC
  Filled 2020-12-29: qty 1

## 2020-12-29 MED ORDER — PROPOFOL 500 MG/50ML IV EMUL
INTRAVENOUS | Status: AC
  Filled 2020-12-29: qty 50

## 2020-12-29 MED ORDER — DEXMEDETOMIDINE (PRECEDEX) IN NS 20 MCG/5ML (4 MCG/ML) IV SYRINGE
PREFILLED_SYRINGE | INTRAVENOUS | Status: DC | PRN
  Administered 2020-12-29: 4 ug via INTRAVENOUS

## 2020-12-29 MED ORDER — EPHEDRINE SULFATE 50 MG/ML IJ SOLN
INTRAMUSCULAR | Status: DC | PRN
  Administered 2020-12-29: 5 mg via INTRAVENOUS

## 2020-12-29 MED ORDER — IBUPROFEN 600 MG PO TABS: 600.0000 mg | ORAL_TABLET | Freq: Four times a day (QID) | ORAL | 0 refills | Status: DC | PRN

## 2020-12-29 MED ORDER — MIDAZOLAM HCL 2 MG/2ML IJ SOLN
INTRAMUSCULAR | Status: AC
  Filled 2020-12-29: qty 2

## 2020-12-29 MED ORDER — KETOROLAC TROMETHAMINE 30 MG/ML IJ SOLN
INTRAMUSCULAR | Status: DC | PRN
  Administered 2020-12-29: 30 mg via INTRAVENOUS

## 2020-12-29 MED ORDER — ONDANSETRON HCL 4 MG/2ML IJ SOLN
INTRAMUSCULAR | Status: AC
  Filled 2020-12-29: qty 2

## 2020-12-29 MED ORDER — HYDROCODONE-ACETAMINOPHEN 5-325 MG PO TABS: 1.0000 | ORAL_TABLET | ORAL | 0 refills | Status: DC | PRN

## 2020-12-29 MED ORDER — KETOROLAC TROMETHAMINE 30 MG/ML IJ SOLN
INTRAMUSCULAR | Status: AC
  Filled 2020-12-29: qty 1

## 2020-12-29 MED ORDER — FENTANYL CITRATE (PF) 100 MCG/2ML IJ SOLN
INTRAMUSCULAR | Status: DC | PRN
  Administered 2020-12-29 (×4): 25 ug via INTRAVENOUS

## 2020-12-29 MED ORDER — POVIDONE-IODINE 10 % EX SWAB: 2.0000 "application " | Freq: Once | CUTANEOUS | Status: DC

## 2020-12-29 MED ORDER — EPHEDRINE 5 MG/ML INJ
INTRAVENOUS | Status: AC
  Filled 2020-12-29: qty 5

## 2020-12-29 MED ORDER — LACTATED RINGERS IV SOLN: INTRAVENOUS | Status: DC | PRN

## 2020-12-29 MED ORDER — PROPOFOL 500 MG/50ML IV EMUL
INTRAVENOUS | Status: DC | PRN
  Administered 2020-12-29: 140 ug/kg/min via INTRAVENOUS

## 2020-12-29 MED ORDER — SODIUM CHLORIDE 0.9 % IV SOLN
100.0000 mg | Freq: Once | INTRAVENOUS | Status: DC
  Filled 2020-12-29: qty 100

## 2020-12-29 MED ORDER — PROPOFOL 10 MG/ML IV BOLUS
INTRAVENOUS | Status: DC | PRN
  Administered 2020-12-29: 70 mg via INTRAVENOUS

## 2020-12-29 MED ORDER — FENTANYL CITRATE (PF) 100 MCG/2ML IJ SOLN
INTRAMUSCULAR | Status: AC
  Filled 2020-12-29: qty 2

## 2020-12-29 MED ORDER — DOXYCYCLINE HYCLATE 100 MG IV SOLR
200.0000 mg | INTRAVENOUS | Status: DC
  Administered 2020-12-29: 100 mg via INTRAVENOUS
  Filled 2020-12-29: qty 200

## 2020-12-29 MED ORDER — MIDAZOLAM HCL 5 MG/5ML IJ SOLN
INTRAMUSCULAR | Status: DC | PRN
  Administered 2020-12-29: 2 mg via INTRAVENOUS

## 2020-12-29 SURGICAL SUPPLY — 22 items
FILTER UTR ASPR SPEC (MISCELLANEOUS) IMPLANT
FLTR UTR ASPR SPEC (MISCELLANEOUS)
GAUZE 4X4 16PLY ~~LOC~~+RFID DBL (SPONGE) ×2 IMPLANT
GLOVE SURG ENC MOIS LTX SZ7 (GLOVE) ×8 IMPLANT
GOWN STRL REUS W/ TWL LRG LVL3 (GOWN DISPOSABLE) ×2 IMPLANT
GOWN STRL REUS W/TWL LRG LVL3 (GOWN DISPOSABLE) ×2
KIT BERKELEY 1ST TRIMESTER 3/8 (MISCELLANEOUS) IMPLANT
KIT TURNOVER CYSTO (KITS) ×2 IMPLANT
MANIFOLD NEPTUNE II (INSTRUMENTS) IMPLANT
NS IRRIG 500ML POUR BTL (IV SOLUTION) ×2 IMPLANT
PACK DNC HYST (MISCELLANEOUS) ×2 IMPLANT
PAD OB MATERNITY 4.3X12.25 (PERSONAL CARE ITEMS) ×2 IMPLANT
PAD PREP 24X41 OB/GYN DISP (PERSONAL CARE ITEMS) ×2 IMPLANT
SCRUB EXIDINE 4% CHG 4OZ (MISCELLANEOUS) ×2 IMPLANT
SET BERKELEY SUCTION TUBING (SUCTIONS) IMPLANT
SLEEVE SCD COMPRESS KNEE MED (STOCKING) ×2 IMPLANT
TOWEL OR 17X26 4PK STRL BLUE (TOWEL DISPOSABLE) ×2 IMPLANT
VACURETTE 10 RIGID CVD (CANNULA) IMPLANT
VACURETTE 12 RIGID CVD (CANNULA) IMPLANT
VACURETTE 8 RIGID CVD (CANNULA) IMPLANT
VACURETTE 8MM F TIP (MISCELLANEOUS) IMPLANT
WATER STERILE IRR 500ML POUR (IV SOLUTION) ×2 IMPLANT

## 2020-12-29 NOTE — Anesthesia Preprocedure Evaluation (Signed)
Anesthesia Evaluation  Patient identified by MRN, date of birth, ID band Patient awake    Reviewed: Allergy & Precautions, NPO status , Patient's Chart, lab work & pertinent test results  History of Anesthesia Complications Negative for: history of anesthetic complications  Airway Mallampati: II  TM Distance: >3 FB Neck ROM: Full    Dental no notable dental hx. (+) Teeth Intact   Pulmonary neg pulmonary ROS, neg sleep apnea, neg COPD, Patient abstained from smoking.Not current smoker,    Pulmonary exam normal breath sounds clear to auscultation       Cardiovascular Exercise Tolerance: Good METS(-) hypertension(-) CAD and (-) Past MI negative cardio ROS  (-) dysrhythmias  Rhythm:Regular Rate:Normal - Systolic murmurs    Neuro/Psych negative neurological ROS  negative psych ROS   GI/Hepatic neg GERD  ,(+)     (-) substance abuse  ,   Endo/Other  neg diabetes  Renal/GU negative Renal ROS     Musculoskeletal   Abdominal   Peds  Hematology   Anesthesia Other Findings History reviewed. No pertinent past medical history.  Reproductive/Obstetrics Missed abortion @ 11 weeks                             Anesthesia Physical Anesthesia Plan  ASA: 2 and emergent  Anesthesia Plan: General   Post-op Pain Management:    Induction: Intravenous  PONV Risk Score and Plan: 3 and Ondansetron, Propofol infusion, TIVA, Midazolam and Treatment may vary due to age or medical condition  Airway Management Planned: Nasal Cannula and Natural Airway  Additional Equipment: None  Intra-op Plan:   Post-operative Plan:   Informed Consent: I have reviewed the patients History and Physical, chart, labs and discussed the procedure including the risks, benefits and alternatives for the proposed anesthesia with the patient or authorized representative who has indicated his/her understanding and acceptance.      Dental advisory given  Plan Discussed with: CRNA and Surgeon  Anesthesia Plan Comments: (Patient NPO since 0900 the day before 12/28/20 (patient not nauseated or vomiting, just was busy and did not have time). Never had anesthesia before. Discussed risks of anesthesia with patient, including possibility of difficulty with spontaneous ventilation under anesthesia necessitating airway intervention, PONV, and rare risks such as cardiac or respiratory or neurological events, and allergic reactions. Discussed the role of CRNA in patient's perioperative care. Patient understands.)        Anesthesia Quick Evaluation

## 2020-12-29 NOTE — Anesthesia Postprocedure Evaluation (Signed)
Anesthesia Post Note  Patient: Michelle Holden  Procedure(s) Performed: DILATATION AND EVACUATION  Patient location during evaluation: PACU Anesthesia Type: General Level of consciousness: awake and alert Pain management: pain level controlled Vital Signs Assessment: post-procedure vital signs reviewed and stable Respiratory status: spontaneous breathing, nonlabored ventilation, respiratory function stable and patient connected to nasal cannula oxygen Cardiovascular status: blood pressure returned to baseline and stable Postop Assessment: no apparent nausea or vomiting Anesthetic complications: no   No notable events documented.   Last Vitals:  Vitals:   12/29/20 0215 12/29/20 0222  BP: 109/70 105/73  Pulse: 73 69  Resp: (!) 21 17  Temp: 36.7 C   SpO2: 100% 100%    Last Pain:  Vitals:   12/29/20 0215  TempSrc:   PainSc: 0-No pain                 Corinda Gubler

## 2020-12-29 NOTE — Addendum Note (Signed)
Addendum  created 12/29/20 0653 by Karoline Caldwell, CRNA   Intraprocedure Meds edited

## 2020-12-29 NOTE — ED Notes (Signed)
Report given to PACU.

## 2020-12-29 NOTE — Op Note (Signed)
Preoperative Diagnosis: 1) 30 y.o. with 6 week 5 day missed abortion  Postoperative Diagnosis: 1) 30 y.o. with 6 week 5 day missed abortion  Operation Performed: Suction dilation and curettage  Indication:  Missed abortion at 6 weeks 5 days gestation  Anesthesia: .General  Primary Surgeon: Vena Austria, MD  Assistant: none  Preoperative Antibiotics: none  Estimated Blood Loss: 50 mL  IV Fluids:  Urine Output:: ~76mL straight cath  Drains or Tubes: none  Implants: none  Specimens Removed: Products of conception  Complications: none  Intraoperative Findings:  cervix fingertip dilated, uterus non-enlarged, anteverted  Patient Condition: stable  Procedure in Detail:  Patient was taken to the operating room were she was administered general endotracheal anesthesia.  She was positioned in the dorsal lithotomy position utilizing Allen stirups, prepped and draped in the usual sterile fashion.  Uterus was noted to be non-enlarged in size, anteverted.   Prior to proceeding with the case a time out was performed.  Attention was turned to the patient's pelvis.  A red rubber catheter was used to empty the patient's bladder.  An operative speculum was placed to allow visualization of the cervix.  The anterior lip of the cervix was grasped with a single tooth tenaculum and the cervix was sequentially dilated using pratt dilators.  A size 8 flexible suction curette was then advanced to the uterine fundus.  Several passes were undertaken to clear the uterine contents.  Sharp curettage was then  performed nothing good uterine cry throughout the cavity.  A final pass of the suction curette was then undertaken.  The resulting specimen was sent to pathology.    The single tooth tenaculum was removed from the cervix.  The tenaculum sites and cervix were noted to be  Hemostatic before removing the operative speculum.  Sponge needle and instrument counts were corrects times two.  The patient  tolerated the procedure well and was taken to the recovery room in stable condition.

## 2020-12-29 NOTE — Transfer of Care (Signed)
Immediate Anesthesia Transfer of Care Note  Patient: Michelle Holden  Procedure(s) Performed: DILATATION AND EVACUATION  Patient Location: PACU  Anesthesia Type:General  Level of Consciousness: awake, alert  and oriented  Airway & Oxygen Therapy: Patient Spontanous Breathing  Post-op Assessment: Report given to RN and Post -op Vital signs reviewed and stable  Post vital signs: Reviewed and stable  Last Vitals:  Vitals Value Taken Time  BP 113/80 12/29/20 0149  Temp 36.6 C 12/29/20 0149  Pulse 100 12/29/20 0149  Resp 16 12/29/20 0149  SpO2 100 % 12/29/20 0149  Vitals shown include unvalidated device data.  Last Pain:  Vitals:   12/29/20 0149  TempSrc:   PainSc: 0-No pain         Complications: No notable events documented.

## 2020-12-30 ENCOUNTER — Encounter: Payer: Self-pay | Admitting: Obstetrics and Gynecology

## 2021-01-01 LAB — SURGICAL PATHOLOGY

## 2021-01-10 ENCOUNTER — Other Ambulatory Visit: Payer: Self-pay

## 2021-01-10 ENCOUNTER — Encounter: Payer: Self-pay | Admitting: Obstetrics and Gynecology

## 2021-01-10 ENCOUNTER — Ambulatory Visit (INDEPENDENT_AMBULATORY_CARE_PROVIDER_SITE_OTHER): Payer: Managed Care, Other (non HMO) | Admitting: Obstetrics and Gynecology

## 2021-01-10 VITALS — BP 118/72 | Ht 66.0 in | Wt 179.0 lb

## 2021-01-10 DIAGNOSIS — Z4889 Encounter for other specified surgical aftercare: Secondary | ICD-10-CM

## 2021-01-10 NOTE — Progress Notes (Signed)
Postoperative Follow-up Patient presents post op from suction D&C 2weeks ago for  missed abortion .  Subjective: Patient reports marked improvement in her preop symptoms. Eating a regular diet without difficulty. The patient is not having any pain.  Activity: normal activities of daily living.  Objective: Blood pressure 118/72, height 5\' 6"  (1.676 m), weight 179 lb (81.2 kg), last menstrual period 10/11/2020.  General: NAD Pulmonary: no increased work of breathing Extremities: no edema Neurologic: normal gait  Admission on 12/28/2020, Discharged on 12/29/2020  Component Date Value Ref Range Status   WBC 12/28/2020 10.0  4.0 - 10.5 K/uL Final   RBC 12/28/2020 3.83 (L)  3.87 - 5.11 MIL/uL Final   Hemoglobin 12/28/2020 11.7 (L)  12.0 - 15.0 g/dL Final   HCT 13/11/2020 34.3 (L)  36.0 - 46.0 % Final   MCV 12/28/2020 89.6  80.0 - 100.0 fL Final   MCH 12/28/2020 30.5  26.0 - 34.0 pg Final   MCHC 12/28/2020 34.1  30.0 - 36.0 g/dL Final   RDW 13/11/2020 12.9  11.5 - 15.5 % Final   Platelets 12/28/2020 262  150 - 400 K/uL Final   nRBC 12/28/2020 0.0  0.0 - 0.2 % Final   Neutrophils Relative % 12/28/2020 73  % Final   Neutro Abs 12/28/2020 7.4  1.7 - 7.7 K/uL Final   Lymphocytes Relative 12/28/2020 19  % Final   Lymphs Abs 12/28/2020 1.9  0.7 - 4.0 K/uL Final   Monocytes Relative 12/28/2020 6  % Final   Monocytes Absolute 12/28/2020 0.6  0.1 - 1.0 K/uL Final   Eosinophils Relative 12/28/2020 1  % Final   Eosinophils Absolute 12/28/2020 0.1  0.0 - 0.5 K/uL Final   Basophils Relative 12/28/2020 1  % Final   Basophils Absolute 12/28/2020 0.1  0.0 - 0.1 K/uL Final   Immature Granulocytes 12/28/2020 0  % Final   Abs Immature Granulocytes 12/28/2020 0.04  0.00 - 0.07 K/uL Final   Performed at [facility], [address] [place], [place]   Sodium 12/28/2020 135  135 - 145 mmol/L Final   Potassium 12/28/2020 3.9  3.5 - 5.1 mmol/L Final   Chloride 12/28/2020 106   98 - 111 mmol/L Final   CO2 12/28/2020 23  22 - 32 mmol/L Final   Glucose, Bld 12/28/2020 107 (H)  70 - 99 mg/dL Final   Glucose reference range applies only to samples taken after fasting for at least 8 hours.   BUN 12/28/2020 8  6 - 20 mg/dL Final   Creatinine, Ser 12/28/2020 0.58  0.44 - 1.00 mg/dL Final   Calcium 13/11/2020 8.7 (L)  8.9 - 10.3 mg/dL Final   Total Protein 41/74/0814 6.8  6.5 - 8.1 g/dL Final   Albumin 48/18/5631 4.0  3.5 - 5.0 g/dL Final   AST 49/70/2637 17  15 - 41 U/L Final   ALT 12/28/2020 14  0 - 44 U/L Final   Alkaline Phosphatase 12/28/2020 48  38 - 126 U/L Final   Total Bilirubin 12/28/2020 0.8  0.3 - 1.2 mg/dL Final   GFR, Estimated 12/28/2020 >60  >60 mL/min Final   Comment: (NOTE) Calculated using the CKD-EPI Creatinine Equation (2021)    Anion gap 12/28/2020 6  5 - 15 Final   Performed at [facility], [address], [place], [place]   hCG, [id number], Conley Rolls, S 12/28/2020 18,365 (H)  <5 mIU/mL Final   Comment:  GEST. AGE      CONC.  (mIU/mL)   <=1 WEEK        5 - 50     2 WEEKS       50 - 500     3 WEEKS       100 - 10,000     4 WEEKS     1,000 - 30,000     5 WEEKS     3,500 - 115,000   6-8 WEEKS     12,000 - 270,000    12 WEEKS     15,000 - 220,000        FEMALE AND NON-PREGNANT FEMALE:     LESS THAN 5 mIU/mL Performed at [facility], [address] [place], [place] [address]    SARS Coronavirus 2 by RT PCR 12/28/2020 NEGATIVE  NEGATIVE Final   Comment: (NOTE) SARS-CoV-2 target nucleic acids are NOT DETECTED.  The SARS-CoV-2 RNA is generally detectable in upper respiratory specimens during the acute phase of infection. The lowest concentration of SARS-CoV-2 viral copies this assay can detect is 138 copies/mL. A negative result does not preclude SARS-Cov-2 infection and should not be used as the sole basis for treatment or other patient management decisions. A negative result may occur with  improper  specimen collection/handling, submission of specimen other than nasopharyngeal swab, presence of viral mutation(s) within the areas targeted by this assay, and inadequate number of viral copies(<138 copies/mL). A negative result must be combined with clinical observations, patient history, and epidemiological information. The expected result is Negative.  Fact Sheet for Patients:  BloggerCourse.com  Fact Sheet for Healthcare Providers:  SeriousBroker.it  This test is no                          t yet approved or cleared by the [place] FDA and  has been authorized for detection and/or diagnosis of SARS-CoV-2 by FDA under an Emergency Use Authorization (EUA). This EUA will remain  in effect (meaning this test can be used) for the duration of the COVID-19 declaration under Section 564(b)(1) of the Act, 21 U.S.C.section 360bbb-3(b)(1), unless the authorization is terminated  or revoked sooner.       Influenza A by PCR 12/28/2020 NEGATIVE  NEGATIVE Final   Influenza B by PCR 12/28/2020 NEGATIVE  NEGATIVE Final   Comment: (NOTE) The Xpert Xpress SARS-CoV-2/FLU/RSV plus assay is intended as an aid in the diagnosis of influenza from Nasopharyngeal swab specimens and should not be used as a sole basis for treatment. Nasal washings and aspirates are unacceptable for Xpert Xpress SARS-CoV-2/FLU/RSV testing.  Fact Sheet for Patients: BloggerCourse.com  Fact Sheet for Healthcare Providers: SeriousBroker.it  This test is not yet approved or cleared by the [place] FDA and has been authorized for detection and/or diagnosis of SARS-CoV-2 by FDA under an Emergency Use Authorization (EUA). This EUA will remain in effect (meaning this test can be used) for the duration of the COVID-19 declaration under Section 564(b)(1) of the Act, 21 U.S.C. section 360bbb-3(b)(1), unless the  authorization is terminated or revoked.  Performed at [facility], [address] [place], [place] [address]    ABO/RH(D) 12/29/2020 A POS   Final   Antibody Screen 12/29/2020 NEG   Final   Sample Expiration 12/29/2020    Final                   Value:01/01/2021,2359 Performed at [facility],  [address], [place], [place] [address]    SURGICAL PATHOLOGY 12/29/2020    Final-Edited                   Value:SURGICAL PATHOLOGY CASE: [id number] PATIENT: Rhianne Schweizer Surgical Pathology Report     Specimen Submitted: A. Products of conception  Clinical History: Missed abortion      DIAGNOSIS: A. PRODUCTS OF CONCEPTION; DILATATION AND EVACUATION: - CHORIONIC VILLI AND DECIDUA, COMPATIBLE WITH PRODUCTS OF CONCEPTION. - NEGATIVE FOR MALIGNANCY.   GROSS DESCRIPTION: A. Labeled: POC Received: Fresh Collection time: 1:30 AM on 12/29/2020 Placed into formalin time: 2:05 AM on 12/29/2020 Tissue fragment(s): Multiple Size: Aggregate, 6.0 x 4.5 x 2.0 cm Villous tissue: Grossly identified Fetal tissue: None grossly identified Comment: Received are multiple tan-brown, hemorrhagic fragments of tissue and 1 fragment of possible membranes  Block summary: 1 -4-representative villous tissue 5-representative membranes  [facility] 12/29/2020  Final Diagnosis performed by Georgeanna Harrison, MD.   Electronically signed 01/01/2021 9:36:03AM The electronic signature indicates that                          the named Attending Pathologist has evaluated the specimen Technical component performed at [facility], [address] [place], [place] [address] Lab: [id number] Dir: Jolene Schimke, MD, MMM  Professional component performed at [facility], [address] [place], [place], [place] [address] Lab: [phone] Dir: Beryle Quant, MD    ABO/RH(D) 12/28/2020    Final                   Value:A POS Performed at [facility], [address] [place], [place] [address]     Assessment: 30 y.o. s/p suction D&C stable  Plan: Patient has done well after surgery with no apparent complications.  I have discussed the post-operative course to date, and the expected progress moving forward.  The patient understands what complications to be concerned about.  I will see the patient in routine follow up, or sooner if needed.    Activity plan: No restriction.   Vena Austria, MD, [facility], [facility] 01/10/2021, 4:42 PM

## 2021-02-06 ENCOUNTER — Encounter: Payer: Self-pay | Admitting: Obstetrics and Gynecology

## 2021-02-06 ENCOUNTER — Other Ambulatory Visit: Payer: Self-pay

## 2021-02-06 ENCOUNTER — Ambulatory Visit: Payer: BLUE CROSS/BLUE SHIELD | Admitting: Obstetrics and Gynecology

## 2021-02-06 VITALS — BP 122/84 | Ht 66.0 in | Wt 180.0 lb

## 2021-02-06 DIAGNOSIS — Z4889 Encounter for other specified surgical aftercare: Secondary | ICD-10-CM

## 2021-02-11 NOTE — Progress Notes (Signed)
Postoperative Follow-up Patient presents post op from suction D&C 6weeks ago for  missed abortion .  Subjective: Patient reports marked improvement in her preop symptoms. Eating a regular diet without difficulty. The patient is not having any pain.  Activity: normal activities of daily living.  Objective: Blood pressure 122/84, height 5\' 6"  (1.676 m), weight 180 lb (81.6 kg), last menstrual period 10/11/2020.  General: NAD Pulmonary: no increased work of breathing Abdomen: soft, non-tender, non-distended GU: normal external female genitalia normal cervix, no CMT, uterus normal in shape and contour, no adnexal tenderness or masses Extremities: no edema Neurologic: normal gait   Admission on 12/28/2020, Discharged on 12/29/2020  Component Date Value Ref Range Status   WBC 12/28/2020 10.0  4.0 - 10.5 K/uL Final   RBC 12/28/2020 3.83 (L)  3.87 - 5.11 MIL/uL Final   Hemoglobin 12/28/2020 11.7 (L)  12.0 - 15.0 g/dL Final   HCT 13/11/2020 34.3 (L)  36.0 - 46.0 % Final   MCV 12/28/2020 89.6  80.0 - 100.0 fL Final   MCH 12/28/2020 30.5  26.0 - 34.0 pg Final   MCHC 12/28/2020 34.1  30.0 - 36.0 g/dL Final   RDW 13/11/2020 12.9  11.5 - 15.5 % Final   Platelets 12/28/2020 262  150 - 400 K/uL Final   nRBC 12/28/2020 0.0  0.0 - 0.2 % Final   Neutrophils Relative % 12/28/2020 73  % Final   Neutro Abs 12/28/2020 7.4  1.7 - 7.7 K/uL Final   Lymphocytes Relative 12/28/2020 19  % Final   Lymphs Abs 12/28/2020 1.9  0.7 - 4.0 K/uL Final   Monocytes Relative 12/28/2020 6  % Final   Monocytes Absolute 12/28/2020 0.6  0.1 - 1.0 K/uL Final   Eosinophils Relative 12/28/2020 1  % Final   Eosinophils Absolute 12/28/2020 0.1  0.0 - 0.5 K/uL Final   Basophils Relative 12/28/2020 1  % Final   Basophils Absolute 12/28/2020 0.1  0.0 - 0.1 K/uL Final   Immature Granulocytes 12/28/2020 0  % Final   Abs Immature Granulocytes 12/28/2020 0.04  0.00 - 0.07 K/uL Final   Performed at [facility],  [address] [place], [place]   Sodium 12/28/2020 135  135 - 145 mmol/L Final   Potassium 12/28/2020 3.9  3.5 - 5.1 mmol/L Final   Chloride 12/28/2020 106  98 - 111 mmol/L Final   CO2 12/28/2020 23  22 - 32 mmol/L Final   Glucose, Bld 12/28/2020 107 (H)  70 - 99 mg/dL Final   Glucose reference range applies only to samples taken after fasting for at least 8 hours.   BUN 12/28/2020 8  6 - 20 mg/dL Final   Creatinine, Ser 12/28/2020 0.58  0.44 - 1.00 mg/dL Final   Calcium 13/11/2020 8.7 (L)  8.9 - 10.3 mg/dL Final   Total Protein 15/06/6977 6.8  6.5 - 8.1 g/dL Final   Albumin 48/02/6551 4.0  3.5 - 5.0 g/dL Final   AST 74/82/7078 17  15 - 41 U/L Final   ALT 12/28/2020 14  0 - 44 U/L Final   Alkaline Phosphatase 12/28/2020 48  38 - 126 U/L Final   Total Bilirubin 12/28/2020 0.8  0.3 - 1.2 mg/dL Final   GFR, Estimated 12/28/2020 >60  >60 mL/min Final   Comment: (NOTE) Calculated using the CKD-EPI Creatinine Equation (2021)    Anion gap 12/28/2020 6  5 - 15 Final   Performed at [facility], [address] [place], [place] [address]   hCG, Beta Chain, Quant, S 12/28/2020 18,365 (H)  <5 mIU/mL Final   Comment:          GEST. AGE      CONC.  (mIU/mL)   <=1 WEEK        5 - 50     2 WEEKS       50 - 500     3 WEEKS       100 - 10,000     4 WEEKS     1,000 - 30,000     5 WEEKS     3,500 - 115,000   6-8 WEEKS     12,000 - 270,000    12 WEEKS     15,000 - 220,000        FEMALE AND NON-PREGNANT FEMALE:     LESS THAN 5 mIU/mL Performed at [facility], [address] [place], [place] [address]    SARS Coronavirus 2 by RT PCR 12/28/2020 NEGATIVE  NEGATIVE Final   Comment: (NOTE) SARS-CoV-2 target nucleic acids are NOT DETECTED.  The SARS-CoV-2 RNA is generally detectable in upper respiratory specimens during the acute phase of infection. The lowest concentration of SARS-CoV-2 viral copies this assay can detect is 138 copies/mL. A negative result does not  preclude SARS-Cov-2 infection and should not be used as the sole basis for treatment or other patient management decisions. A negative result may occur with  improper specimen collection/handling, submission of specimen other than nasopharyngeal swab, presence of viral mutation(s) within the areas targeted by this assay, and inadequate number of viral copies(<138 copies/mL). A negative result must be combined with clinical observations, patient history, and epidemiological information. The expected result is Negative.  Fact Sheet for Patients:  BloggerCourse.com  Fact Sheet for Healthcare Providers:  SeriousBroker.it  This test is no                          t yet approved or cleared by the [place] FDA and  has been authorized for detection and/or diagnosis of SARS-CoV-2 by FDA under an Emergency Use Authorization (EUA). This EUA will remain  in effect (meaning this test can be used) for the duration of the COVID-19 declaration under Section 564(b)(1) of the Act, 21 U.S.C.section 360bbb-3(b)(1), unless the authorization is terminated  or revoked sooner.       Influenza A by PCR 12/28/2020 NEGATIVE  NEGATIVE Final   Influenza B by PCR 12/28/2020 NEGATIVE  NEGATIVE Final   Comment: (NOTE) The Xpert Xpress SARS-CoV-2/FLU/RSV plus assay is intended as an aid in the diagnosis of influenza from Nasopharyngeal swab specimens and should not be used as a sole basis for treatment. Nasal washings and aspirates are unacceptable for Xpert Xpress SARS-CoV-2/FLU/RSV testing.  Fact Sheet for Patients: BloggerCourse.com  Fact Sheet for Healthcare Providers: SeriousBroker.it  This test is not yet approved or cleared by the [place] FDA and has been authorized for detection and/or diagnosis of SARS-CoV-2 by FDA under an Emergency Use Authorization (EUA). This EUA will remain in  effect (meaning this test can be used) for the duration of the COVID-19 declaration under Section 564(b)(1) of the Act, 21 U.S.C. section 360bbb-3(b)(1), unless the authorization is terminated or revoked.  Performed at [facility], [address] [place], [place] [address]    ABO/RH(D) 12/29/2020 A POS   Final   Antibody Screen 12/29/2020 NEG   Final   Sample  Expiration 12/29/2020    Final                   Value:01/01/2021,2359 Performed at [facility], [address] [place], [place] [address]    SURGICAL PATHOLOGY 12/29/2020    Final-Edited                   Value:SURGICAL PATHOLOGY CASE: [id number] PATIENT: [facility] Surgical Pathology Report     Specimen Submitted: A. Products of conception  Clinical History: Missed abortion      DIAGNOSIS: A. PRODUCTS OF CONCEPTION; DILATATION AND EVACUATION: - CHORIONIC VILLI AND DECIDUA, COMPATIBLE WITH PRODUCTS OF CONCEPTION. - NEGATIVE FOR MALIGNANCY.   GROSS DESCRIPTION: A. Labeled: POC Received: Fresh Collection time: 1:30 AM on 12/29/2020 Placed into formalin time: 2:05 AM on 12/29/2020 Tissue fragment(s): Multiple Size: Aggregate, 6.0 x 4.5 x 2.0 cm Villous tissue: Grossly identified Fetal tissue: None grossly identified Comment: Received are multiple tan-brown, hemorrhagic fragments of tissue and 1 fragment of possible membranes  Block summary: 1 -4-representative villous tissue 5-representative membranes  [facility] 12/29/2020  Final Diagnosis performed by Georgeanna Harrison, MD.   Electronically signed 01/01/2021 9:36:03AM The electronic signature indicates that                          the named Attending Pathologist has evaluated the specimen Technical component performed at [facility], [address] [place], [place] [address] Lab: [phone] Dir: Jolene Schimke, MD, MMM  Professional component performed at [facility], [facility], [address] [place], [place], [place]  [address] Lab: [phone] Dir: Beryle Quant, MD    ABO/RH(D) 12/28/2020    Final                   Value:A POS Performed at [facility], [address] [place], [place] [address]     Assessment: 30 y.o. s/p suction D&C stable  Plan: Patient has done well after surgery with no apparent complications.  I have discussed the post-operative course to date, and the expected progress moving forward.  The patient understands what complications to be concerned about.  I will see the patient in routine follow up, or sooner if needed.    Activity plan: No restriction.   Vena Austria, MD, [facility], [facility]

## 2021-02-18 NOTE — L&D Delivery Note (Signed)
Obstetrical Delivery Note   Date of Delivery:   01/07/2022 Primary OB:   [facility]  Gestational Age/EDD: [redacted]w[redacted]d (Dated by 9wk [place]) Reason for Admission: Labor Antepartum complications: none  Delivered By:   Siri Cole, CNM   Delivery Type:   spontaneous vaginal delivery  Delivery Details:   Arrived in early labor VE 4/80/-3. Received a labor epidural. Augmented with Pitocin and AROM. Found to complete and +1 station at 1612.  She began pushing then. Over the next hour she made consistent progress. While reclined with legs back she had SVB of viable female at 51. Infant birthed OA to ROA, summersaulted through Nuchal x 2. Spontaneous cry once unwound form cord, HR> 100, pink by 5 mins. Cord double clamped and cut by dad once pulsation ceased. Placenta delivered with maternal effort. On inspection of perineum a 3a laceration was found, repair started by Dr Valentino Saxon with 2-0 Vicryl, then completed by this cnm.One interrupted stitch placed with 3-0 viclry to better approximate laceration.  Laceration well approximated and hemostatic. Infant skin to skin with mother, both stable.  Anesthesia:    epidural Intrapartum complications: Postpartum Hemorrhage GBS:    Negative Laceration:    3rd degree Episiotomy:    none Rectal exam:   Deferred  Placenta:    Delivered and expressed via active management. Intact: yes. To pathology: no.  Delayed Cord Clamping: yes Estimated Blood Loss:   Baby:    Liveborn female, APGARs 8/9, weight 3770gm Carie Caddy, [place]  [facility]  11/202/2023 6:56 PM

## 2021-03-04 ENCOUNTER — Encounter: Payer: Self-pay | Admitting: Obstetrics and Gynecology

## 2021-03-05 ENCOUNTER — Other Ambulatory Visit: Payer: Self-pay | Admitting: Obstetrics & Gynecology

## 2021-03-05 ENCOUNTER — Other Ambulatory Visit: Payer: Self-pay

## 2021-03-05 ENCOUNTER — Other Ambulatory Visit: Payer: BLUE CROSS/BLUE SHIELD

## 2021-03-05 DIAGNOSIS — N926 Irregular menstruation, unspecified: Secondary | ICD-10-CM

## 2021-03-05 NOTE — Telephone Encounter (Signed)
Lab today as this is close to [facility] last month.

## 2021-03-06 LAB — BETA HCG QUANT (REF LAB): hCG Quant: 6 m[IU]/mL

## 2021-04-13 ENCOUNTER — Other Ambulatory Visit: Payer: Self-pay | Admitting: Obstetrics & Gynecology

## 2021-05-24 ENCOUNTER — Other Ambulatory Visit (HOSPITAL_COMMUNITY)
Admission: RE | Admit: 2021-05-24 | Discharge: 2021-05-24 | Disposition: A | Discharge status: Home / Self Care | Payer: BLUE CROSS/BLUE SHIELD | Source: Ambulatory Visit | Attending: Obstetrics | Admitting: Obstetrics

## 2021-05-24 ENCOUNTER — Ambulatory Visit (INDEPENDENT_AMBULATORY_CARE_PROVIDER_SITE_OTHER): Payer: BLUE CROSS/BLUE SHIELD | Admitting: Obstetrics

## 2021-05-24 ENCOUNTER — Encounter: Payer: Self-pay | Admitting: Obstetrics

## 2021-05-24 VITALS — BP 122/76 | Wt 171.0 lb

## 2021-05-24 DIAGNOSIS — Z3A01 Less than 8 weeks gestation of pregnancy: Secondary | ICD-10-CM

## 2021-05-24 DIAGNOSIS — Z113 Encounter for screening for infections with a predominantly sexual mode of transmission: Secondary | ICD-10-CM | POA: Diagnosis present

## 2021-05-24 DIAGNOSIS — Z3480 Encounter for supervision of other normal pregnancy, unspecified trimester: Secondary | ICD-10-CM

## 2021-05-24 DIAGNOSIS — Z124 Encounter for screening for malignant neoplasm of cervix: Secondary | ICD-10-CM | POA: Diagnosis present

## 2021-05-24 NOTE — Progress Notes (Signed)
NOB today. LMP 03/30/2021. Need urine culture at [facility] ?

## 2021-05-24 NOTE — Progress Notes (Signed)
? ? ? ? ? ?New Obstetric Patient H&P  ? ? ?Chief Complaint: "Desires prenatal care" ? ? ?History of Present Illness: Patient is a 31 y.o. [id number] Not Hispanic or Latino female, LMP 03/30/2021 presents with amenorrhea and positive home pregnancy test. Based on her  LMP, her EDD is Estimated Date of Delivery: 01/04/22 and her EGA is [redacted]w[redacted]d. Cycles are , regular. Her last pap smear was in 2021 and was NILM.  ?  ?She had a urine pregnancy test which was positive 2 week(s)  ago. Her last menstrual period was normal and lasted for  5 or 6 day(s). Since her LMP she claims she has experienced nausea, fatigue, breast tenderness and mild cramping. She denies vaginal bleeding. Her past medical history is noncontributory. Her prior pregnancies are notable for  2 recent SABs . Patient expressing moderate anxiety today, secondary to history of recent miscarriages.  ? ?Since her LMP, she admits to the use of tobacco products  no ?She claims she has gained no pounds since the start of her pregnancy.  ?There are cats in the home in the home no  ?She admits close contact with children on a regular basis  no ?She has had chicken pox in the past yes ?She has had Tuberculosis exposures, symptoms, or previously tested positive for TB   no ?Current or past history of domestic violence. no ? ?Genetic Screening/Teratology Counseling: (Includes patient, baby's father, or anyone in either family with:)  ? ?1. Patient's age >/= 33 [facility]  no ?2. Thalassemia ([place], [place], Mediterranean, or Asian background): MCV<80  no ?3. Neural tube defect (meningomyelocele, spina bifida, anencephaly)  no ?4. Congenital heart defect  no  ?5. Down syndrome  no ?6. Tay-Sachs (Jewish, [place]  no ?7. Canavan's Disease  no ?8. Sickle cell disease or trait (African)  no  ?9. Hemophilia or other blood disorders  no  ?10. Muscular dystrophy  no  ?11. Cystic fibrosis  no  ?12. Huntington's Chorea  no  ?13. Mental retardation/autism  no ?14. Other inherited  genetic or chromosomal disorder  no ?15. Maternal metabolic disorder (DM, PKU, etc)  no ?16. Patient or FOB with a child with a birth defect not listed above no  ?16a. Patient or FOB with a birth defect themselves no ?17. Recurrent pregnancy loss, or stillbirth  yes  ?18. Any medications since LMP other than prenatal vitamins (include vitamins, supplements, OTC meds, drugs, alcohol)  not applicable ?19. Any other genetic/environmental exposure to discuss  no ? ?Infection History:  ? ?1. Lives with someone with TB or TB exposed  no  ?2. Patient or partner has history of genital herpes  no ?3. Rash or viral illness since LMP  no ?4. History of STI (GC, CT, HPV, syphilis, HIV)  no ?5. History of recent travel :  no ? ?Other pertinent information:  no ? ? ? ?Review of Systems:10 point review of systems negative unless otherwise noted in HPI ?Review of Systems  ?Constitutional: Negative.   ?HENT: Negative.    ?Eyes: Negative.   ?Respiratory: Negative.    ?Cardiovascular: Negative.   ?Gastrointestinal: Negative.   ?Genitourinary: Negative.   ?Musculoskeletal: Negative.   ?Skin: Negative.   ?Neurological: Negative.   ?Endo/Heme/Allergies: Negative.   ?Psychiatric/Behavioral:  The patient is nervous/anxious.   ?     R/t recent SABs   ?Past Medical History:  ?Past Medical History:  ?Diagnosis Date  ? Miscarriage   ? ? ?Past Surgical History:  ?Past Surgical  History:  ?Procedure Laterality Date  ? DILATION AND EVACUATION N/A 12/29/2020  ? Procedure: DILATATION AND EVACUATION;  Surgeon: Vena Austria, MD;  Location: [facility] ORS;  Service: Gynecology;  Laterality: N/A;  ? ? ?Gynecologic History: Patient's last menstrual period was 03/30/2021. ? ?Obstetric History: [id number] ? ?Family History:  ?Family History  ?Problem Relation Age of Onset  ? Diabetes Maternal Aunt   ? ? ?Social History:  ?Social History  ? ?Socioeconomic History  ? Marital status: Married  ?  Spouse name: Not on file  ? Number of children: Not on file  ? Years  of education: Not on file  ? Highest education level: Not on file  ?Occupational History  ? Not on file  ?Tobacco Use  ? Smoking status: Never  ? Smokeless tobacco: Never  ?Vaping Use  ? Vaping Use: Never used  ?Substance and Sexual Activity  ? Alcohol use: Not Currently  ? Drug use: Never  ? Sexual activity: Yes  ?  Birth control/protection: None  ?Other Topics Concern  ? Not on file  ?Social History Narrative  ? Not on file  ? ?Social Determinants of Health  ? ?Financial Resource Strain: Not on file  ?Food Insecurity: Not on file  ?Transportation Needs: Not on file  ?Physical Activity: Not on file  ?Stress: Not on file  ?Social Connections: Not on file  ?Intimate Partner Violence: Not on file  ? ? ?Allergies:  ?No Known Allergies ? ?Medications: ?Prior to Admission medications   ?Medication Sig Start Date End Date Taking? Authorizing Provider  ?Prenatal Vit-Fe Fumarate-FA (MULTIVITAMIN-PRENATAL) 27-0.8 MG TABS tablet Take 1 tablet by mouth daily at 12 noon.   Yes [provider]  ?HYDROcodone-acetaminophen (NORCO/VICODIN) 5-325 MG tablet Take 1 tablet by mouth every 4 (four) hours as needed for moderate pain or severe pain. ?Patient not taking: Reported on 02/06/2021 12/29/20   Vena Austria, MD  ?ibuprofen (ADVIL) 600 MG tablet Take 1 tablet (600 mg total) by mouth every 6 (six) hours as needed for cramping or mild pain. ?Patient not taking: Reported on 02/06/2021 12/29/20   Vena Austria, MD  ? ? ?Physical Exam ?Vitals: Blood pressure 122/76, weight 171 lb (77.6 kg), last menstrual period 03/30/2021. ? ?General: NAD ?HEENT: normocephalic, anicteric ?Thyroid: no enlargement, no palpable nodules ?Pulmonary: No increased work of breathing, CTAB ?Cardiovascular: RRR, distal pulses 2+ ?Abdomen: NABS, soft, non-tender, non-distended.  Umbilicus without lesions.  No hepatomegaly, splenomegaly or masses palpable. No evidence of hernia  ?Genitourinary: ? External: Normal external female genitalia.   Normal urethral meatus, normal  Bartholin's and Skene's glands.   ? Vagina: Normal vaginal mucosa, no evidence of prolapse.   ? Cervix: Grossly normal in appearance, bleeding elicited with collection of PAP smear and Aptima swab. Light bleeding initiated immediately upon contact of cervix with first PAP swab.  ? Uterus:  Non-enlarged, mobile, normal contour.  No CMT. Scant blood visualized on gloves after completion of bimanual exam.  ? Adnexa: ovaries non-enlarged, no adnexal masses ? Rectal: deferred ?Extremities: no edema, erythema, or tenderness ?Neurologic: Grossly intact ?Psychiatric: mood appropriate, affect full, pt is anxious r/t recent pregnancy losses.  ? ? ?Assessment: 31 y.o. [id number] at [redacted]w[redacted]d presenting to initiate prenatal care ?Hx of two SABS in last year. ? ?Plan: ?1) Avoid alcoholic beverages. ?2) Patient encouraged not to smoke.  ?3) Discontinue the use of all non-medicinal drugs and chemicals.  ?4) Take prenatal vitamins daily.  ?5) Nutrition, food safety (fish, cheese advisories, and high nitrite foods) and  exercise discussed. ?6) Hospital and practice style discussed with cross coverage system.  ?7) Genetic Screening, such as with 1st Trimester Screening, cell free fetal DNA, AFP testing, and Ultrasound, as well as with amniocentesis and CVS as appropriate, is discussed with patient. At the conclusion of today's visit patient thinks she is interested in genetic testing ?8) Patient is asked about travel to areas at risk for the Zika virus, and counseled to avoid travel and exposure to mosquitoes or sexual partners who may have themselves been exposed to the virus. Testing is discussed, and will be ordered as appropriate.  ?9) Discussed the bleeding with PAP collection at that this can be a normal occurrence with collection of PAP specimen. ?10) Counseled regarding coping to deal with increased anxiety related to recent pregnancy losses and the viability of current pregnancy. Visualization and  relaxation discussed as options.  ? ? ?Lamont SnowballSara Warren-Hill, SNM ?Mirna MiresMargaret M Yvana Samonte, CNM  ?05/24/2021 11:15 AM  ? ? ? ? ? ?

## 2021-05-25 LAB — CERVICOVAGINAL ANCILLARY ONLY
Bacterial Vaginitis (gardnerella): NEGATIVE
Chlamydia: NEGATIVE
Comment: NEGATIVE
Comment: NEGATIVE
Comment: NEGATIVE
Comment: NORMAL
Neisseria Gonorrhea: NEGATIVE
Trichomonas: NEGATIVE

## 2021-05-28 LAB — CYTOLOGY - PAP: Diagnosis: NEGATIVE

## 2021-05-29 ENCOUNTER — Encounter: Payer: Self-pay | Admitting: Obstetrics

## 2021-05-29 DIAGNOSIS — Z3400 Encounter for supervision of normal first pregnancy, unspecified trimester: Secondary | ICD-10-CM | POA: Insufficient documentation

## 2021-05-29 DIAGNOSIS — Z3490 Encounter for supervision of normal pregnancy, unspecified, unspecified trimester: Secondary | ICD-10-CM | POA: Insufficient documentation

## 2021-06-07 ENCOUNTER — Ambulatory Visit
Admission: RE | Admit: 2021-06-07 | Discharge: 2021-06-07 | Disposition: A | Discharge status: Home / Self Care | Payer: BLUE CROSS/BLUE SHIELD | Source: Ambulatory Visit | Attending: Obstetrics | Admitting: Obstetrics

## 2021-06-07 DIAGNOSIS — Z3480 Encounter for supervision of other normal pregnancy, unspecified trimester: Secondary | ICD-10-CM | POA: Diagnosis not present

## 2021-06-07 DIAGNOSIS — Z3A09 9 weeks gestation of pregnancy: Secondary | ICD-10-CM | POA: Insufficient documentation

## 2021-06-07 DIAGNOSIS — Z8759 Personal history of other complications of pregnancy, childbirth and the puerperium: Secondary | ICD-10-CM | POA: Diagnosis not present

## 2021-06-11 ENCOUNTER — Encounter: Payer: Self-pay | Admitting: Obstetrics

## 2021-06-14 ENCOUNTER — Ambulatory Visit (INDEPENDENT_AMBULATORY_CARE_PROVIDER_SITE_OTHER): Payer: BLUE CROSS/BLUE SHIELD | Admitting: Licensed Practical Nurse

## 2021-06-14 ENCOUNTER — Encounter: Payer: Self-pay | Admitting: Licensed Practical Nurse

## 2021-06-14 VITALS — BP 122/70 | Wt 181.0 lb

## 2021-06-14 DIAGNOSIS — Z3480 Encounter for supervision of other normal pregnancy, unspecified trimester: Secondary | ICD-10-CM

## 2021-06-14 DIAGNOSIS — Z3A10 10 weeks gestation of pregnancy: Secondary | ICD-10-CM

## 2021-06-14 DIAGNOSIS — Z1379 Encounter for other screening for genetic and chromosomal anomalies: Secondary | ICD-10-CM

## 2021-06-14 NOTE — Progress Notes (Signed)
Routine Prenatal Care Visit ? ?Subjective  ?Michelle Holden is a 31 y.o. [phone] at [redacted]w[redacted]d being seen today for ongoing prenatal care.  She is currently monitored for the following issues for this low-risk pregnancy and has Missed abortion and Supervision of normal first pregnancy, antepartum on their problem list.  ?----------------------------------------------------------------------------------- ?Patient reports fatigue and nausea.  Desires genetic testing as she wants to know gender.  ?Contractions: Not present. Vag. Bleeding: None.  Movement: Absent. Leaking Fluid denies.  ?----------------------------------------------------------------------------------- ?The following portions of the patient's history were reviewed and updated as appropriate: allergies, current medications, past family history, past medical history, past social history, past surgical history and problem list. Problem list updated. ? ?Objective  ?Blood pressure 122/70, weight 181 lb (82.1 kg), last menstrual period 03/30/2021. ?Pregravid weight 171 lb (77.6 kg) Total Weight Gain 10 lb (4.536 kg) ?Urinalysis: Urine Protein    Urine Glucose   ? ?Fetal Status: Fetal Heart Rate (bpm): 170   Movement: Absent    ? ?General:  Alert, oriented and cooperative. Patient is in no acute distress.  ?Skin: Skin is warm and dry. No rash noted.   ?Cardiovascular: Normal heart rate noted  ?Respiratory: Normal respiratory effort, no problems with respiration noted  ?Abdomen: Soft, gravid, appropriate for gestational age. Pain/Pressure: Absent     ?Pelvic:  Cervical exam deferred        ?Extremities: Normal range of motion.     ?Mental Status: Normal mood and affect. Normal behavior. Normal judgment and thought content.  ? ?Assessment  ? ?31 y.o. [id number] at [redacted]w[redacted]d by  01/04/2022, by Last Menstrual Period presenting for routine prenatal visit ? ?Plan  ? ?pregnancy Problems (from 05/24/21 to present)   ? ? Problem Noted Resolved  ? Supervision of normal first  pregnancy, antepartum 05/29/2021 by Mirna Mires, CNM No  ? Overview Addendum 05/29/2021 10:14 AM by Mirna Mires, CNM  ?   ?Nursing Staff Provider  ?Office Location  [facility] Dating    ?Language  English Anatomy US    ?Flu Vaccine   Genetic Screen  NIPS:   ?TDaP vaccine    Hgb A1C or  ?GTT Early : ?Third trimester :   ?Covid    LAB RESULTS   ?Rhogam   Blood Type     ?Feeding Plan  Antibody    ?Contraception  Rubella    ?Circumcision  RPR     ?Pediatrician   HBsAg     ?Support Person  HIV    ?Prenatal Classes  Varicella   ?  GBS  (For PCN allergy, check sensitivities)   ?BTL Consent     ?VBAC Consent  Pap  NILM  ?  Hgb Electro    ?  CF   ?   SMA   ?     ? ? ?  ?  ? ?  ?  ? ?general obstetric precautions including but not limited to vaginal bleeding, contractions, leaking of fluid and fetal movement were reviewed in detail with the patient. ?Please refer to After Visit Summary for other counseling recommendations.  ? ?Return in about 4 weeks (around 07/12/2021) for ROB. ? ?Prenatal and genetic labs collected  ? ?HO for unisom and B6 given  ? ?Carie Caddy, CNM  ?Domingo Pulse, [facility]  ?06/14/21  ?2:24 PM  ? ? ?

## 2021-06-15 LAB — CBC/D/PLT+RPR+RH+ABO+RUBIGG...
Antibody Screen: NEGATIVE
Basophils Absolute: 0.1 10*3/uL (ref 0.0–0.2)
Basos: 1 %
EOS (ABSOLUTE): 0.2 10*3/uL (ref 0.0–0.4)
Eos: 2 %
HCV Ab: NONREACTIVE
HIV Screen 4th Generation wRfx: NONREACTIVE
Hematocrit: 37.4 % (ref 34.0–46.6)
Hemoglobin: 12.5 g/dL (ref 11.1–15.9)
Hepatitis B Surface Ag: NEGATIVE
Immature Grans (Abs): 0 10*3/uL (ref 0.0–0.1)
Immature Granulocytes: 0 %
Lymphocytes Absolute: 2.3 10*3/uL (ref 0.7–3.1)
Lymphs: 24 %
MCH: 30.1 pg (ref 26.6–33.0)
MCHC: 33.4 g/dL (ref 31.5–35.7)
MCV: 90 fL (ref 79–97)
Monocytes Absolute: 0.7 10*3/uL (ref 0.1–0.9)
Monocytes: 8 %
Neutrophils Absolute: 6.3 10*3/uL (ref 1.4–7.0)
Neutrophils: 65 %
Platelets: 272 10*3/uL (ref 150–450)
RBC: 4.15 x10E6/uL (ref 3.77–5.28)
RDW: 13.4 % (ref 11.7–15.4)
RPR Ser Ql: NONREACTIVE
Rh Factor: POSITIVE
Rubella Antibodies, IGG: 5.69 index (ref >0.99)
Varicella zoster IgG: 663 index (ref >165)
WBC: 9.6 10*3/uL (ref 3.4–10.8)

## 2021-06-15 LAB — HCV INTERPRETATION

## 2021-06-16 LAB — URINE DRUG PANEL 7
Amphetamines, Urine: NEGATIVE ng/mL
Barbiturate Quant, Ur: NEGATIVE ng/mL
Benzodiazepine Quant, Ur: NEGATIVE ng/mL
Cannabinoid Quant, Ur: NEGATIVE ng/mL
Cocaine (Metab.): NEGATIVE ng/mL
Opiate Quant, Ur: NEGATIVE ng/mL
PCP Quant, Ur: NEGATIVE ng/mL

## 2021-06-16 LAB — URINE CULTURE

## 2021-06-20 LAB — MATERNIT 21 PLUS CORE, BLOOD
Fetal Fraction: 6
Result (T21): NEGATIVE
Trisomy 13 (Patau syndrome): NEGATIVE
Trisomy 18 (Edwards syndrome): NEGATIVE
Trisomy 21 (Down syndrome): NEGATIVE

## 2021-07-13 ENCOUNTER — Encounter: Payer: Self-pay | Admitting: Advanced Practice Midwife

## 2021-07-13 ENCOUNTER — Ambulatory Visit (INDEPENDENT_AMBULATORY_CARE_PROVIDER_SITE_OTHER): Payer: BLUE CROSS/BLUE SHIELD | Admitting: Advanced Practice Midwife

## 2021-07-13 VITALS — BP 100/60 | Wt 184.0 lb

## 2021-07-13 DIAGNOSIS — Z3A15 15 weeks gestation of pregnancy: Secondary | ICD-10-CM

## 2021-07-13 DIAGNOSIS — Z3482 Encounter for supervision of other normal pregnancy, second trimester: Secondary | ICD-10-CM

## 2021-07-13 DIAGNOSIS — Z369 Encounter for antenatal screening, unspecified: Secondary | ICD-10-CM

## 2021-07-13 NOTE — Patient Instructions (Signed)

## 2021-07-13 NOTE — Progress Notes (Signed)
ROB- no concerns 

## 2021-07-13 NOTE — Progress Notes (Addendum)
Routine Prenatal Care Visit  Subjective  Michelle Holden is a 31 y.o. [id number] at [redacted]w[redacted]d being seen today for ongoing prenatal care.  She is currently monitored for the following issues for this low-risk pregnancy and has Missed abortion; Supervision of normal pregnancy; and History of third degree perineal laceration on their problem list.  ----------------------------------------------------------------------------------- Patient reports  she is generally feeling better for the past 2 weeks. She was induced at 41 weeks with her last delivery for 8 pound baby with 3rd degree laceration .   Contractions: Not present. Vag. Bleeding: None.  Movement: Present. Leaking Fluid denies.  ----------------------------------------------------------------------------------- The following portions of the patient's history were reviewed and updated as appropriate: allergies, current medications, past family history, past medical history, past social history, past surgical history and problem list. Problem list updated.  Objective  Blood pressure 100/60, weight 184 lb (83.5 kg), last menstrual period 03/30/2021. Pregravid weight 171 lb (77.6 kg) Total Weight Gain 13 lb (5.897 kg) Urinalysis: Urine Protein    Urine Glucose    Fetal Status: Fetal Heart Rate (bpm): 162   Movement: Present     General:  Alert, oriented and cooperative. Patient is in no acute distress.  Skin: Skin is warm and dry. No rash noted.   Cardiovascular: Normal heart rate noted  Respiratory: Normal respiratory effort, no problems with respiration noted  Abdomen: Soft, gravid, appropriate for gestational age. Pain/Pressure: Absent     Pelvic:  Cervical exam deferred        Extremities: Normal range of motion.     Mental Status: Normal mood and affect. Normal behavior. Normal judgment and thought content.   Assessment   31 y.o. [id number] at [redacted]w[redacted]d by  01/04/2022, by Last Menstrual Period presenting for routine prenatal visit  Plan    pregnancy Problems (from 05/24/21 to present)     Problem Noted Resolved   Supervision of normal pregnancy 05/29/2021 by Mirna Mires, CNM No   Overview Addendum 05/29/2021 10:14 AM by Mirna Mires, CNM     Nursing Staff Provider  Office Location  [facility] Dating    Language  English Anatomy US    Flu Vaccine   Genetic Screen  NIPS:   TDaP vaccine    Hgb A1C or  GTT Early : Third trimester :   Covid    LAB RESULTS   Rhogam   Blood Type     Feeding Plan  Antibody    Contraception  Rubella    Circumcision  RPR     Pediatrician   HBsAg     Support Person  HIV    Prenatal Classes  Varicella     GBS  (For PCN allergy, check sensitivities)   BTL Consent     VBAC Consent  Pap  NILM    Hgb Electro      CF      SMA                    Preterm labor symptoms and general obstetric precautions including but not limited to vaginal bleeding, contractions, leaking of fluid and fetal movement were reviewed in detail with the patient. Please refer to After Visit Summary for other counseling recommendations.   Return in about 4 weeks (around 08/10/2021) for anatomy scan and rob after.  Tresea Mall, CNM 07/13/2021 11:40 AM

## 2021-07-26 ENCOUNTER — Other Ambulatory Visit: Payer: BLUE CROSS/BLUE SHIELD

## 2021-08-09 ENCOUNTER — Ambulatory Visit
Admission: RE | Admit: 2021-08-09 | Discharge: 2021-08-09 | Disposition: A | Discharge status: Home / Self Care | Payer: BLUE CROSS/BLUE SHIELD | Source: Ambulatory Visit | Attending: Advanced Practice Midwife | Admitting: Advanced Practice Midwife

## 2021-08-09 DIAGNOSIS — Z3689 Encounter for other specified antenatal screening: Secondary | ICD-10-CM | POA: Insufficient documentation

## 2021-08-09 DIAGNOSIS — Z3482 Encounter for supervision of other normal pregnancy, second trimester: Secondary | ICD-10-CM

## 2021-08-09 DIAGNOSIS — Z3A19 19 weeks gestation of pregnancy: Secondary | ICD-10-CM | POA: Insufficient documentation

## 2021-08-09 DIAGNOSIS — Z369 Encounter for antenatal screening, unspecified: Secondary | ICD-10-CM

## 2021-08-10 ENCOUNTER — Ambulatory Visit: Payer: BLUE CROSS/BLUE SHIELD

## 2021-08-22 ENCOUNTER — Ambulatory Visit (INDEPENDENT_AMBULATORY_CARE_PROVIDER_SITE_OTHER): Payer: BLUE CROSS/BLUE SHIELD | Admitting: Obstetrics & Gynecology

## 2021-08-22 VITALS — BP 120/74 | Wt 194.8 lb

## 2021-08-22 DIAGNOSIS — Z3A20 20 weeks gestation of pregnancy: Secondary | ICD-10-CM

## 2021-08-22 LAB — POCT URINALYSIS DIPSTICK OB
Glucose, UA: NEGATIVE
POC,PROTEIN,UA: NEGATIVE

## 2021-08-22 NOTE — Progress Notes (Signed)
Subjective:    Michelle Holden is a 31 y.o. female being seen today for her obstetrical visit. She is at [redacted]w[redacted]d gestation. Patient reports no complaints. Fetal movement: normal.  Review of Systems:   Review of Systems   Objective:    BP 120/74   Wt 194 lb 12.8 oz (88.4 kg)   LMP 03/30/2021   BMI 31.44 kg/m   Physical Exam  Exam  Well nourished, well hydrated White female, no apparent distress She is ambulating and conversing normally.   FHT:  156 BPM  Uterine Size: 21 cm        Assessment:    Pregnancy:  [id number]   - Routine care Come back 4 weeks/prn sooner Plan:    Patient Active Problem List   Diagnosis Date Noted  . Supervision of normal pregnancy 05/29/2021  . Missed abortion   . History of third degree perineal laceration 05/17/2019

## 2021-09-06 ENCOUNTER — Telehealth: Payer: Self-pay

## 2021-09-06 NOTE — Telephone Encounter (Signed)
Pt calling; will be 23wks tomorrow; on Mon the vision in her right eye was 1/2 black; couldn't really see; no H/As or High BP. Also has call in to her eye doctor.  [phone]  Pt states she had a very slight H/A Monday when this started but she hadn't had her coffee yet and doesn't think it's related to this; does not have a way to ck her BP at home; her parent are a few miles away and she can take BP at their house; adv to call and leave msg on nurse line what BP is and to see what Eye MD says.

## 2021-09-06 NOTE — Telephone Encounter (Signed)
Called pt to see what she had found out; pt states she has an appt c eye dr at 1:30 tomorrow.

## 2021-09-07 NOTE — Telephone Encounter (Signed)
Patient contacted office back  and states that she saw eye doctor and exam was normal. Patient denies history of hypertension or hyperglycemia, she states last incident was on Wednesday and sates that her right eye had went blurry, no other associated symptoms. Patient states that she feels well today, advised patient to remain hydrated in heat and reduce activities for now. Patient denies feeling light headed or dizzy, chest pain or pressure. Advised patient if symptom returns not to wait and report to ED for evaluation. Advised patient that I will forward to doctor on call for advise and to review. KW

## 2021-09-20 ENCOUNTER — Ambulatory Visit (INDEPENDENT_AMBULATORY_CARE_PROVIDER_SITE_OTHER): Payer: BLUE CROSS/BLUE SHIELD | Admitting: Obstetrics

## 2021-09-20 VITALS — BP 122/70 | Wt 197.0 lb

## 2021-09-20 DIAGNOSIS — Z3A24 24 weeks gestation of pregnancy: Secondary | ICD-10-CM

## 2021-09-20 DIAGNOSIS — Z3482 Encounter for supervision of other normal pregnancy, second trimester: Secondary | ICD-10-CM

## 2021-09-20 NOTE — Progress Notes (Signed)
Routine Prenatal Care Visit  Subjective  Michelle Holden is a 31 y.o. [phone] at [redacted]w[redacted]d being seen today for ongoing prenatal care.  She is currently monitored for the following issues for this low-risk pregnancy and has Missed abortion; Supervision of normal pregnancy; and History of third degree perineal laceration on their problem list.  --------------------------------------------Good fetal movement. Occasional BH UCs. Contractions: Not present. Vag. Bleeding: None.  Movement: Present. Leaking Fluid denies.  ----------------------------------------------------------------------------------- The following portions of the patient's history were reviewed and updated as appropriate: allergies, current medications, past family history, past medical history, past social history, past surgical history and problem list. Problem list updated.  Objective  Blood pressure 122/70, weight 197 lb (89.4 kg), last menstrual period 03/30/2021. Pregravid weight 171 lb (77.6 kg) Total Weight Gain 26 lb (11.8 kg) Urinalysis: Urine Protein    Urine Glucose    Fetal Status:     Movement: Present     General:  Alert, oriented and cooperative. Patient is in no acute distress.  Skin: Skin is warm and dry. No rash noted.   Cardiovascular: Normal heart rate noted  Respiratory: Normal respiratory effort, no problems with respiration noted  Abdomen: Soft, gravid, appropriate for gestational age. Pain/Pressure: Absent     Pelvic:  Cervical exam deferred        Extremities: Normal range of motion.     Mental Status: Normal mood and affect. Normal behavior. Normal judgment and thought content.   Assessment   31 y.o. [id number] at [redacted]w[redacted]d by  01/04/2022, by Last Menstrual Period presenting for routine prenatal visit  Plan   pregnancy Problems (from 05/24/21 to present)    Problem Noted Resolved   Supervision of normal pregnancy 05/29/2021 by Mirna Mires, CNM No   Overview Addendum 09/20/2021 10:33 AM by Mirna Mires, CNM     Nursing Staff Provider  Office Location  [facility] Dating    Language  English Anatomy US    Flu Vaccine   Genetic Screen  NIPS:   TDaP vaccine    Hgb A1C or  GTT Early : Third trimester :   Covid    LAB RESULTS   Rhogam   Blood Type A/Positive/-- (04/27 1429)   Feeding Plan  Antibody Negative (04/27 1429)  Contraception  Rubella 5.69 (04/27 1429)  Circumcision  RPR Non Reactive (04/27 1429)   Pediatrician   HBsAg Negative (04/27 1429)   Support Person  HIV Non Reactive (04/27 1429)  Prenatal Classes  Varicella     GBS  (For PCN allergy, check sensitivities)   BTL Consent     VBAC Consent  Pap  NILM    Hgb Electro      CF      SMA                   Preterm labor symptoms and general obstetric precautions including but not limited to vaginal bleeding, contractions, leaking of fluid and fetal movement were reviewed in detail with the patient. Please refer to After Visit Summary for other counseling recommendations.   Return in about 4 weeks (around 10/18/2021) for return OB, 28 weeks labs.  Mirna Mires, CNM  09/20/2021 10:33 AM

## 2021-09-20 NOTE — Progress Notes (Signed)
No vb. No lof.  

## 2021-09-27 NOTE — Telephone Encounter (Signed)
Pt has been seen and evaluated since this note.

## 2021-10-18 ENCOUNTER — Other Ambulatory Visit: Payer: BLUE CROSS/BLUE SHIELD

## 2021-10-18 ENCOUNTER — Other Ambulatory Visit: Payer: Self-pay | Admitting: Obstetrics

## 2021-10-18 ENCOUNTER — Ambulatory Visit (INDEPENDENT_AMBULATORY_CARE_PROVIDER_SITE_OTHER): Payer: BLUE CROSS/BLUE SHIELD | Admitting: Obstetrics

## 2021-10-18 VITALS — BP 122/80 | Wt 203.0 lb

## 2021-10-18 DIAGNOSIS — Z3A28 28 weeks gestation of pregnancy: Secondary | ICD-10-CM

## 2021-10-18 DIAGNOSIS — Z3482 Encounter for supervision of other normal pregnancy, second trimester: Secondary | ICD-10-CM

## 2021-10-18 DIAGNOSIS — Z3480 Encounter for supervision of other normal pregnancy, unspecified trimester: Secondary | ICD-10-CM

## 2021-10-18 NOTE — Progress Notes (Signed)
Routine Prenatal Care Visit  Subjective  Michelle Holden is a 31 y.o. [phone] at [redacted]w[redacted]d being seen today for ongoing prenatal care.  She is currently monitored for the following issues for this low-risk pregnancy and has Missed abortion; Supervision of normal pregnancy; and History of third degree perineal laceration on their problem list.  ----------------------------------------------------------------------------------- Patient reports no complaints.   Contractions: Not present. Vag. Bleeding: None.  Movement: Present. Leaking Fluid denies.  ----------------------------------------------------------------------------------- The following portions of the patient's history were reviewed and updated as appropriate: allergies, current medications, past family history, past medical history, past social history, past surgical history and problem list. Problem list updated.  Objective  Blood pressure 122/80, weight 203 lb (92.1 kg), last menstrual period 03/30/2021. Pregravid weight 171 lb (77.6 kg) Total Weight Gain 32 lb (14.5 kg) Urinalysis: Urine Protein    Urine Glucose    Fetal Status:     Movement: Present     General:  Alert, oriented and cooperative. Patient is in no acute distress.  Skin: Skin is warm and dry. No rash noted.   Cardiovascular: Normal heart rate noted  Respiratory: Normal respiratory effort, no problems with respiration noted  Abdomen: Soft, gravid, appropriate for gestational age. Pain/Pressure: Absent     Pelvic:  Cervical exam deferred        Extremities: Normal range of motion.     Mental Status: Normal mood and affect. Normal behavior. Normal judgment and thought content.   Assessment   31 y.o. [id number] at [redacted]w[redacted]d by  01/04/2022, by Last Menstrual Period presenting for routine prenatal visit  Plan   pregnancy Problems (from 05/24/21 to present)    Problem Noted Resolved   Supervision of normal pregnancy 05/29/2021 by Mirna Mires, CNM No   Overview Addendum  09/20/2021 10:33 AM by Mirna Mires, CNM     Nursing Staff Provider  Office Location  [facility] Dating    Language  English Anatomy US    Flu Vaccine   Genetic Screen  NIPS:   TDaP vaccine    Hgb A1C or  GTT Early : Third trimester :   Covid    LAB RESULTS   Rhogam   Blood Type A/Positive/-- (04/27 1429)   Feeding Plan  Antibody Negative (04/27 1429)  Contraception  Rubella 5.69 (04/27 1429)  Circumcision  RPR Non Reactive (04/27 1429)   Pediatrician   HBsAg Negative (04/27 1429)   Support Person  HIV Non Reactive (04/27 1429)  Prenatal Classes  Varicella     GBS  (For PCN allergy, check sensitivities)   BTL Consent     VBAC Consent  Pap  NILM    Hgb Electro      CF      SMA                   Preterm labor symptoms and general obstetric precautions including but not limited to vaginal bleeding, contractions, leaking of fluid and fetal movement were reviewed in detail with the patient. Please refer to After Visit Summary for other counseling recommendations.  Her 28 weeks labs are drawn today.  Return in about 2 weeks (around 11/01/2021) for return OB. I have ordered a f/u scan to complete her anatomy- they did not see the mouth and lip/palate.  Mirna Mires, CNM  10/18/2021 9:52 AM

## 2021-10-19 LAB — 28 WEEK RH+PANEL
Basophils Absolute: 0.1 10*3/uL (ref 0.0–0.2)
Basos: 1 %
EOS (ABSOLUTE): 0.2 10*3/uL (ref 0.0–0.4)
Eos: 2 %
Gestational Diabetes Screen: 117 mg/dL (ref 70–139)
HIV Screen 4th Generation wRfx: NONREACTIVE
Hematocrit: 32.5 % — ABNORMAL LOW (ref 34.0–46.6)
Hemoglobin: 11.1 g/dL (ref 11.1–15.9)
Immature Grans (Abs): 0.1 10*3/uL (ref 0.0–0.1)
Immature Granulocytes: 1 %
Lymphocytes Absolute: 1.3 10*3/uL (ref 0.7–3.1)
Lymphs: 17 %
MCH: 31 pg (ref 26.6–33.0)
MCHC: 34.2 g/dL (ref 31.5–35.7)
MCV: 91 fL (ref 79–97)
Monocytes Absolute: 0.5 10*3/uL (ref 0.1–0.9)
Monocytes: 7 %
Neutrophils Absolute: 5.9 10*3/uL (ref 1.4–7.0)
Neutrophils: 72 %
Platelets: 230 10*3/uL (ref 150–450)
RBC: 3.58 x10E6/uL — ABNORMAL LOW (ref 3.77–5.28)
RDW: 12.5 % (ref 11.7–15.4)
RPR Ser Ql: NONREACTIVE
WBC: 8 10*3/uL (ref 3.4–10.8)

## 2021-11-01 ENCOUNTER — Ambulatory Visit (INDEPENDENT_AMBULATORY_CARE_PROVIDER_SITE_OTHER): Payer: BLUE CROSS/BLUE SHIELD | Admitting: Licensed Practical Nurse

## 2021-11-01 ENCOUNTER — Encounter: Payer: Self-pay | Admitting: Licensed Practical Nurse

## 2021-11-01 VITALS — BP 128/80 | Wt 210.0 lb

## 2021-11-01 DIAGNOSIS — Z3A30 30 weeks gestation of pregnancy: Secondary | ICD-10-CM

## 2021-11-01 DIAGNOSIS — Z3480 Encounter for supervision of other normal pregnancy, unspecified trimester: Secondary | ICD-10-CM

## 2021-11-01 LAB — POCT URINALYSIS DIPSTICK
Glucose, UA: NEGATIVE
Protein, UA: NEGATIVE

## 2021-11-01 NOTE — Progress Notes (Signed)
Routine Prenatal Care Visit  Subjective  Michelle Holden is a 31 y.o. [phone] at [redacted]w[redacted]d being seen today for ongoing prenatal care.  She is currently monitored for the following issues for this low-risk pregnancy and has Missed abortion; Supervision of normal pregnancy; and History of third degree perineal laceration on their problem list.  ----------------------------------------------------------------------------------- Patient reports no complaints.  Doing well.  Feels really good.  Contractions: Not present. Vag. Bleeding: None.  Movement: Present. Leaking Fluid denies.  ----------------------------------------------------------------------------------- The following portions of the patient's history were reviewed and updated as appropriate: allergies, current medications, past family history, past medical history, past social history, past surgical history and problem list. Problem list updated.  Objective  Blood pressure 128/80, weight 210 lb (95.3 kg), last menstrual period 03/30/2021. Pregravid weight 171 lb (77.6 kg) Total Weight Gain 39 lb (17.7 kg) Urinalysis: Urine Protein    Urine Glucose    Fetal Status: Fetal Heart Rate (bpm): 144 Fundal Height: 32 cm Movement: Present     General:  Alert, oriented and cooperative. Patient is in no acute distress.  Skin: Skin is warm and dry. No rash noted.   Cardiovascular: Normal heart rate noted  Respiratory: Normal respiratory effort, no problems with respiration noted  Abdomen: Soft, gravid, appropriate for gestational age. Pain/Pressure: Absent     Pelvic:  Cervical exam performed        Extremities: Normal range of motion.     Mental Status: Normal mood and affect. Normal behavior. Normal judgment and thought content.   Assessment   31 y.o. [id number] at 101w6d by  01/04/2022, by Last Menstrual Period presenting for routine prenatal visit  Plan   pregnancy Problems (from 05/24/21 to present)     Problem Noted Resolved   Supervision  of normal pregnancy 05/29/2021 by Mirna Mires, CNM No   Overview Addendum 10/23/2021 12:00 PM by Mirna Mires, CNM     Nursing Staff Provider  Office Location  [facility] Dating    Language  English Anatomy US    Flu Vaccine   Genetic Screen  NIPS:   TDaP vaccine    Hgb A1C or  GTT Early : Third trimester : 117  Covid    LAB RESULTS   Rhogam   Blood Type A/Positive/-- (04/27 1429)   Feeding Plan  Antibody Negative (04/27 1429)  Contraception  Rubella 5.69 (04/27 1429)  Circumcision  RPR Non Reactive (04/27 1429)   Pediatrician   HBsAg Negative (04/27 1429)   Support Person  HIV Non Reactive (04/27 1429)  Prenatal Classes  Varicella     GBS  (For PCN allergy, check sensitivities)   BTL Consent     VBAC Consent  Pap  NILM    Hgb Electro      CF      SMA                    Preterm labor symptoms and general obstetric precautions including but not limited to vaginal bleeding, contractions, leaking of fluid and fetal movement were reviewed in detail with the patient. Please refer to After Visit Summary for other counseling recommendations.   Return in about 2 weeks (around 11/15/2021) for ROB.  Carie Caddy, CNM  Domingo Pulse, [facility]  11/01/21  11:31 AM

## 2021-11-05 ENCOUNTER — Encounter: Payer: Self-pay | Admitting: Licensed Practical Nurse

## 2021-11-14 ENCOUNTER — Ambulatory Visit (INDEPENDENT_AMBULATORY_CARE_PROVIDER_SITE_OTHER): Payer: BLUE CROSS/BLUE SHIELD | Admitting: Obstetrics & Gynecology

## 2021-11-14 VITALS — BP 130/64 | Wt 211.2 lb

## 2021-11-14 DIAGNOSIS — Z3A32 32 weeks gestation of pregnancy: Secondary | ICD-10-CM

## 2021-11-14 LAB — POCT URINALYSIS DIPSTICK OB
Glucose, UA: NEGATIVE
POC,PROTEIN,UA: NEGATIVE

## 2021-11-14 NOTE — Progress Notes (Signed)
wOLFO

## 2021-11-17 ENCOUNTER — Encounter: Payer: Self-pay | Admitting: Obstetrics & Gynecology

## 2021-11-29 ENCOUNTER — Ambulatory Visit (INDEPENDENT_AMBULATORY_CARE_PROVIDER_SITE_OTHER): Payer: BLUE CROSS/BLUE SHIELD | Admitting: Certified Nurse Midwife

## 2021-11-29 ENCOUNTER — Encounter: Payer: Self-pay | Admitting: Certified Nurse Midwife

## 2021-11-29 VITALS — BP 117/79 | HR 83 | Wt 213.4 lb

## 2021-11-29 DIAGNOSIS — Z23 Encounter for immunization: Secondary | ICD-10-CM | POA: Diagnosis not present

## 2021-11-29 DIAGNOSIS — Z3A34 34 weeks gestation of pregnancy: Secondary | ICD-10-CM

## 2021-11-29 DIAGNOSIS — Z3483 Encounter for supervision of other normal pregnancy, third trimester: Secondary | ICD-10-CM

## 2021-11-29 LAB — POCT URINALYSIS DIPSTICK OB
Bilirubin, UA: NEGATIVE
Blood, UA: NEGATIVE
Glucose, UA: NEGATIVE
Ketones, UA: NEGATIVE
Leukocytes, UA: NEGATIVE
Nitrite, UA: NEGATIVE
POC,PROTEIN,UA: NEGATIVE
Spec Grav, UA: 1.015 (ref 1.010–1.025)
Urobilinogen, UA: 0.2 E.U./dL
pH, UA: 6.5 (ref 5.0–8.0)

## 2021-11-29 NOTE — Patient Instructions (Signed)

## 2021-11-29 NOTE — Progress Notes (Signed)
Body mass index is 34.44 kg/m.  Rob doing well, discussed GBS testing next visit. She verbalizes and agrees. Pt has questions about Tdap, all questions answered. PT request to have Tdap. Given today . She is feeling good movement. Denies any other issues/concerns. Follow up 1 wk for ROB.   Philip Aspen, CN M

## 2021-11-29 NOTE — Addendum Note (Signed)
Addended by: Minette Headland on: 11/29/2021 09:23 AM   Modules accepted: Orders

## 2021-12-10 ENCOUNTER — Other Ambulatory Visit (HOSPITAL_COMMUNITY)
Admission: RE | Admit: 2021-12-10 | Discharge: 2021-12-10 | Disposition: A | Discharge status: Home / Self Care | Payer: BLUE CROSS/BLUE SHIELD | Source: Ambulatory Visit | Attending: Obstetrics | Admitting: Obstetrics

## 2021-12-10 ENCOUNTER — Ambulatory Visit (INDEPENDENT_AMBULATORY_CARE_PROVIDER_SITE_OTHER): Payer: BLUE CROSS/BLUE SHIELD | Admitting: Obstetrics

## 2021-12-10 VITALS — BP 119/84 | HR 76 | Wt 215.4 lb

## 2021-12-10 DIAGNOSIS — Z3483 Encounter for supervision of other normal pregnancy, third trimester: Secondary | ICD-10-CM

## 2021-12-10 DIAGNOSIS — Z3A36 36 weeks gestation of pregnancy: Secondary | ICD-10-CM

## 2021-12-10 DIAGNOSIS — Z113 Encounter for screening for infections with a predominantly sexual mode of transmission: Secondary | ICD-10-CM | POA: Insufficient documentation

## 2021-12-10 LAB — POCT URINALYSIS DIPSTICK OB
Bilirubin, UA: NEGATIVE
Blood, UA: NEGATIVE
Glucose, UA: NEGATIVE
Ketones, UA: NEGATIVE
Leukocytes, UA: NEGATIVE
Nitrite, UA: NEGATIVE
POC,PROTEIN,UA: NEGATIVE
Spec Grav, UA: 1.015 (ref 1.010–1.025)
Urobilinogen, UA: 0.2 E.U./dL
pH, UA: 6.5 (ref 5.0–8.0)

## 2021-12-10 NOTE — Addendum Note (Signed)
Addended by: Landis Gandy on: 12/10/2021 04:12 PM   Modules accepted: Orders

## 2021-12-10 NOTE — Progress Notes (Signed)
ROB at [redacted]w[redacted]d. Active baby. Denies ctx, LOF, and vaginal bleeding. Alicianna feels done with pregnancy and would like to discuss IOL - does not want to go past 41 weeks ago. Discussed scheduling induction if still pregnant after 40 weeks. She had a non-medical 4D [place] at which the baby's upper lip was visualized; declines any other [place]. Discussed labor prep, [place] circuit. Baby is OP and not engaged in pelvis. GBS and GC/chlamydia swabs self-collected. RTC in 1 week.  Lloyd Huger, CNM

## 2021-12-11 LAB — CERVICOVAGINAL ANCILLARY ONLY
Comment: NEGATIVE
Comment: NORMAL

## 2021-12-12 ENCOUNTER — Encounter: Payer: Self-pay | Admitting: Obstetrics

## 2021-12-14 LAB — CULTURE, BETA STREP (GROUP B ONLY): Strep Gp B Culture: NEGATIVE

## 2021-12-17 ENCOUNTER — Ambulatory Visit (INDEPENDENT_AMBULATORY_CARE_PROVIDER_SITE_OTHER): Payer: BLUE CROSS/BLUE SHIELD | Admitting: Obstetrics

## 2021-12-17 ENCOUNTER — Other Ambulatory Visit (HOSPITAL_COMMUNITY)
Admission: RE | Admit: 2021-12-17 | Discharge: 2021-12-17 | Disposition: A | Discharge status: Home / Self Care | Payer: BLUE CROSS/BLUE SHIELD | Source: Ambulatory Visit | Attending: Obstetrics | Admitting: Obstetrics

## 2021-12-17 VITALS — BP 133/84 | HR 80 | Wt 217.0 lb

## 2021-12-17 DIAGNOSIS — Z113 Encounter for screening for infections with a predominantly sexual mode of transmission: Secondary | ICD-10-CM | POA: Insufficient documentation

## 2021-12-17 DIAGNOSIS — Z3480 Encounter for supervision of other normal pregnancy, unspecified trimester: Secondary | ICD-10-CM

## 2021-12-17 DIAGNOSIS — Z3483 Encounter for supervision of other normal pregnancy, third trimester: Secondary | ICD-10-CM

## 2021-12-17 DIAGNOSIS — Z3A37 37 weeks gestation of pregnancy: Secondary | ICD-10-CM

## 2021-12-17 LAB — POCT URINALYSIS DIPSTICK OB
Bilirubin, UA: NEGATIVE
Blood, UA: NEGATIVE
Glucose, UA: NEGATIVE
Ketones, UA: NEGATIVE
Leukocytes, UA: NEGATIVE
Nitrite, UA: NEGATIVE
POC,PROTEIN,UA: NEGATIVE
Spec Grav, UA: 1.01 (ref 1.010–1.025)
Urobilinogen, UA: 0.2 E.U./dL
pH, UA: 6 (ref 5.0–8.0)

## 2021-12-17 NOTE — Progress Notes (Signed)
No vb. No lof. Recollect aptima today d/t insufficient sample from LV

## 2021-12-17 NOTE — Progress Notes (Signed)
Routine Prenatal Care Visit  Subjective  Michelle Holden is a 31 y.o. [phone] at [redacted]w[redacted]d being seen today for ongoing prenatal care.  She is currently monitored for the following issues for this low-risk pregnancy and has Missed abortion; Supervision of normal pregnancy; and History of third degree perineal laceration on their problem list.  ----------------------------------------------------------------------------------- Patient reports no complaints.  She is walking daily, using natural cervical ripeners. Needs aptima today- her GBS is negative. Contractions: Not present. Vag. Bleeding: None.  Movement: Present. Leaking Fluid denies.  ----------------------------------------------------------------------------------- The following portions of the patient's history were reviewed and updated as appropriate: allergies, current medications, past family history, past medical history, past social history, past surgical history and problem list. Problem list updated.  Objective  Blood pressure 133/84, pulse 80, weight 98.4 kg, last menstrual period 03/30/2021. Pregravid weight 77.6 kg Total Weight Gain 20.9 kg Urinalysis: Urine Protein    Urine Glucose    Fetal Status:     Movement: Present     General:  Alert, oriented and cooperative. Patient is in no acute distress.  Skin: Skin is warm and dry. No rash noted.   Cardiovascular: Normal heart rate noted  Respiratory: Normal respiratory effort, no problems with respiration noted  Abdomen: Soft, gravid, appropriate for gestational age. Pain/Pressure: Absent     Pelvic:  Cervical exam deferred        Extremities: Normal range of motion.     Mental Status: Normal mood and affect. Normal behavior. Normal judgment and thought content.   Assessment   31 y.o. [id number] at [redacted]w[redacted]d by  01/04/2022, by Last Menstrual Period presenting for routine prenatal visit  Plan   pregnancy Problems (from 05/24/21 to present)    Problem Noted Resolved   Supervision  of normal pregnancy 05/29/2021 by Imagene Riches, CNM No   Overview Addendum 12/17/2021  8:18 AM by Imagene Riches, CNM     Nursing Staff Provider  Office Location  [facility] Dating  [redacted]w[redacted]d on 06/07/21  Language  English Anatomy US  Ant lip not seen otherwise normal   Flu Vaccine   Genetic Screen  NIPS:   TDaP vaccine    Hgb A1C or  GTT Early : Third trimester : 117  Covid    LAB RESULTS   Rhogam  NA Blood Type A/Positive/-- (04/27 1429)   Feeding Plan Breast Antibody Negative (04/27 1429)  Contraception IUD?Vasectomy? Rubella 5.69 (04/27 1429)  Circumcision  RPR Non Reactive (04/27 1429)   Pediatrician   HBsAg Negative (04/27 1429)   Support Person  HIV Non Reactive (04/27 1429)  Prenatal Classes multip  Varicella immune    GBS  (For PCN allergy, check sensitivities) negative  BTL Consent     VBAC Consent  Pap  NILM    Hgb Electro      CF      SMA                   Term labor symptoms and general obstetric precautions including but not limited to vaginal bleeding, contractions, leaking of fluid and fetal movement were reviewed in detail with the patient. Please refer to After Visit Summary for other counseling recommendations.   No follow-ups on file.  Imagene Riches, CNM  12/17/2021 8:18 AM

## 2021-12-17 NOTE — Addendum Note (Signed)
Addended by: Drenda Freeze on: 12/17/2021 08:39 AM   Modules accepted: Orders

## 2021-12-18 LAB — CERVICOVAGINAL ANCILLARY ONLY
Chlamydia: NEGATIVE
Comment: NEGATIVE
Comment: NEGATIVE
Comment: NORMAL
Neisseria Gonorrhea: NEGATIVE
Trichomonas: NEGATIVE

## 2021-12-24 ENCOUNTER — Encounter: Payer: Self-pay | Admitting: Advanced Practice Midwife

## 2021-12-24 ENCOUNTER — Ambulatory Visit (INDEPENDENT_AMBULATORY_CARE_PROVIDER_SITE_OTHER): Payer: BLUE CROSS/BLUE SHIELD | Admitting: Advanced Practice Midwife

## 2021-12-24 VITALS — BP 120/80 | Wt 222.0 lb

## 2021-12-24 DIAGNOSIS — Z3A38 38 weeks gestation of pregnancy: Secondary | ICD-10-CM

## 2021-12-24 DIAGNOSIS — Z3483 Encounter for supervision of other normal pregnancy, third trimester: Secondary | ICD-10-CM

## 2021-12-24 DIAGNOSIS — O09293 Supervision of pregnancy with other poor reproductive or obstetric history, third trimester: Secondary | ICD-10-CM

## 2021-12-24 LAB — POCT URINALYSIS DIPSTICK OB
Bilirubin, UA: NEGATIVE
Blood, UA: NEGATIVE
Glucose, UA: NEGATIVE
Ketones, UA: NEGATIVE
Leukocytes, UA: NEGATIVE
Nitrite, UA: NEGATIVE
POC,PROTEIN,UA: NEGATIVE
Spec Grav, UA: 1.01 (ref 1.010–1.025)
Urobilinogen, UA: 0.2 E.U./dL
pH, UA: 5 (ref 5.0–8.0)

## 2021-12-24 NOTE — Addendum Note (Signed)
Addended by: Quintella Baton D on: 12/24/2021 01:35 PM   Modules accepted: Orders

## 2021-12-24 NOTE — Progress Notes (Signed)
Routine Prenatal Care Visit  Subjective  Michelle Holden is a 31 y.o. [phone] at [redacted]w[redacted]d being seen today for ongoing prenatal care.  She is currently monitored for the following issues for this low-risk pregnancy and has Missed abortion; Supervision of normal pregnancy; and History of third degree perineal laceration on their problem list.  ----------------------------------------------------------------------------------- Patient reports no complaints.  She is ready for delivery. She desires 40 week IOL which can be scheduled at her next visit. Contractions: Not present. Vag. Bleeding: None.  Movement: Present. Leaking Fluid denies.  ----------------------------------------------------------------------------------- The following portions of the patient's history were reviewed and updated as appropriate: allergies, current medications, past family history, past medical history, past social history, past surgical history and problem list. Problem list updated.  Objective  Blood pressure 120/80, weight 222 lb (100.7 kg), last menstrual period 03/30/2021. Pregravid weight 171 lb (77.6 kg) Total Weight Gain 51 lb (23.1 kg) Urinalysis: Urine Protein    Urine Glucose    Fetal Status: Fetal Heart Rate (bpm): 123 Fundal Height: 38 cm Movement: Present     General:  Alert, oriented and cooperative. Patient is in no acute distress.  Skin: Skin is warm and dry. No rash noted.   Cardiovascular: Normal heart rate noted  Respiratory: Normal respiratory effort, no problems with respiration noted  Abdomen: Soft, gravid, appropriate for gestational age. Pain/Pressure: Absent     Pelvic:  Cervical exam deferred        Extremities: Normal range of motion.  Edema: None  Mental Status: Normal mood and affect. Normal behavior. Normal judgment and thought content.   Assessment   31 y.o. [id number] at [redacted]w[redacted]d by  01/04/2022, by Last Menstrual Period presenting for routine prenatal visit  Plan   pregnancy Problems  (from 05/24/21 to present)     Problem Noted Resolved   Supervision of normal pregnancy 05/29/2021 by Imagene Riches, CNM No   Overview Addendum 12/17/2021  8:18 AM by Imagene Riches, CNM     Nursing Staff Provider  Office Location  [facility] Dating  [redacted]w[redacted]d on 06/07/21  Language  English Anatomy US  Ant lip not seen otherwise normal   Flu Vaccine   Genetic Screen  NIPS:   TDaP vaccine    Hgb A1C or  GTT Early : Third trimester : 117  Covid    LAB RESULTS   Rhogam  NA Blood Type A/Positive/-- (04/27 1429)   Feeding Plan Breast Antibody Negative (04/27 1429)  Contraception IUD?Vasectomy? Rubella 5.69 (04/27 1429)  Circumcision  RPR Non Reactive (04/27 1429)   Pediatrician   HBsAg Negative (04/27 1429)   Support Person  HIV Non Reactive (04/27 1429)  Prenatal Classes multip  Varicella immune    GBS  (For PCN allergy, check sensitivities) negative  BTL Consent     VBAC Consent  Pap  NILM    Hgb Electro      CF      SMA                   Term labor symptoms and general obstetric precautions including but not limited to vaginal bleeding, contractions, leaking of fluid and fetal movement were reviewed in detail with the patient. Please refer to After Visit Summary for other counseling recommendations.   Return in about 1 week (around 12/31/2021) for rob.  Rod Can, CNM 12/24/2021 1:25 PM

## 2022-01-02 ENCOUNTER — Encounter: Payer: Self-pay | Admitting: Obstetrics and Gynecology

## 2022-01-02 ENCOUNTER — Observation Stay
Admission: EM | Admit: 2022-01-02 | Discharge: 2022-01-02 | Disposition: A | Discharge status: Home / Self Care | Payer: BLUE CROSS/BLUE SHIELD | Attending: Obstetrics and Gynecology | Admitting: Obstetrics and Gynecology

## 2022-01-02 ENCOUNTER — Ambulatory Visit (INDEPENDENT_AMBULATORY_CARE_PROVIDER_SITE_OTHER): Payer: BLUE CROSS/BLUE SHIELD | Admitting: Obstetrics

## 2022-01-02 ENCOUNTER — Other Ambulatory Visit: Payer: Self-pay

## 2022-01-02 VITALS — BP 159/102

## 2022-01-02 DIAGNOSIS — Z3490 Encounter for supervision of normal pregnancy, unspecified, unspecified trimester: Secondary | ICD-10-CM

## 2022-01-02 DIAGNOSIS — Z3483 Encounter for supervision of other normal pregnancy, third trimester: Secondary | ICD-10-CM

## 2022-01-02 DIAGNOSIS — O163 Unspecified maternal hypertension, third trimester: Secondary | ICD-10-CM

## 2022-01-02 DIAGNOSIS — O133 Gestational [pregnancy-induced] hypertension without significant proteinuria, third trimester: Secondary | ICD-10-CM | POA: Diagnosis present

## 2022-01-02 DIAGNOSIS — Z3A39 39 weeks gestation of pregnancy: Secondary | ICD-10-CM | POA: Diagnosis not present

## 2022-01-02 LAB — COMPREHENSIVE METABOLIC PANEL
ALT: 15 U/L (ref 0–44)
AST: 23 U/L (ref 15–41)
Albumin: 3.2 g/dL — ABNORMAL LOW (ref 3.5–5.0)
Alkaline Phosphatase: 125 U/L (ref 38–126)
Anion gap: 9 (ref 5–15)
BUN: 9 mg/dL (ref 6–20)
CO2: 19 mmol/L — ABNORMAL LOW (ref 22–32)
Calcium: 8.8 mg/dL — ABNORMAL LOW (ref 8.9–10.3)
Chloride: 106 mmol/L (ref 98–111)
Creatinine, Ser: 0.66 mg/dL (ref 0.44–1.00)
GFR, Estimated: >60 mL/min (ref >60)
Glucose, Bld: 102 mg/dL — ABNORMAL HIGH (ref 70–99)
Potassium: 3.5 mmol/L (ref 3.5–5.1)
Sodium: 134 mmol/L — ABNORMAL LOW (ref 135–145)
Total Bilirubin: 0.6 mg/dL (ref 0.3–1.2)
Total Protein: 6.6 g/dL (ref 6.5–8.1)

## 2022-01-02 LAB — POCT URINALYSIS DIPSTICK OB
Bilirubin, UA: NEGATIVE
Blood, UA: NEGATIVE
Glucose, UA: NEGATIVE
Ketones, UA: NEGATIVE
Leukocytes, UA: NEGATIVE
Nitrite, UA: NEGATIVE
POC,PROTEIN,UA: NEGATIVE
Spec Grav, UA: 1.015 (ref 1.010–1.025)
Urobilinogen, UA: 0.2 E.U./dL
pH, UA: 6.5 (ref 5.0–8.0)

## 2022-01-02 LAB — CBC
HCT: 34.5 % — ABNORMAL LOW (ref 36.0–46.0)
Hemoglobin: 11.7 g/dL — ABNORMAL LOW (ref 12.0–15.0)
MCH: 29.7 pg (ref 26.0–34.0)
MCHC: 33.9 g/dL (ref 30.0–36.0)
MCV: 87.6 fL (ref 80.0–100.0)
Platelets: 222 10*3/uL (ref 150–400)
RBC: 3.94 MIL/uL (ref 3.87–5.11)
RDW: 13.5 % (ref 11.5–15.5)
WBC: 10.3 10*3/uL (ref 4.0–10.5)
nRBC: 0 % (ref 0.0–0.2)

## 2022-01-02 LAB — PROTEIN / CREATININE RATIO, URINE
Creatinine, Urine: 35 mg/dL
Total Protein, Urine: <6 mg/dL

## 2022-01-02 NOTE — Progress Notes (Signed)
Michelle Holden at [redacted]w[redacted]d. Active baby. Having some BH ctx. Denies LOF, vaginal bleeding. Elevated BPs today: 159/102, repeat 158/105. Denies HA, visual changes, endorses some epigastric pain. Michelle Holden has been feeling lightheaded/dizzy and "off" for the past 2-3 days. Recommend to go straight to L&D for evaluation and possible IOL. Michelle Holden is in agreement with plan. Report called to Doreene Burke, CNM.  Michelle Holden Spanish, CNM

## 2022-01-02 NOTE — OB Triage Note (Signed)
Patient is a 31 yo, G4P1, at 39 weeks 5 days. Patient presents to L&D triage after being sent over from the office for elevated BPs. Patient reports seeing occasional flashes of light over the past two days, patient denies any headaches, or epigastric pain. Reflexes+2, clonus absent on exam. Patient denies any vaginal bleeding or LOF. Patient reports +FM. Monitors applied and assessing. BPs elevated, all other VS WNL. Initial fetal heart tone 145. Janee Morn CNM notified of patients arrival to unit. Plan to place in observation for fetal monitoring and pre-e labs.

## 2022-01-02 NOTE — OB Triage Note (Signed)
     L&D OB Triage Note   SUBJECTIVE Michelle Holden is a 31 y.o. [id number] female at [redacted]w[redacted]d, EDD Estimated Date of Delivery: 01/04/22 who presented to triage from the office for pre eclampsia evaluation.  Pt state that her blood pressure does tend to elevate when she becomes nervous.  Pt denies headache, visual changes today but has had x2  over the past 2 days. She also denies epigastric pain.   OB History  Gravida Para Term Preterm AB Living  4 1 1  0 2 1  SAB IAB Ectopic Multiple Live Births  2 0 0 0 0    # Outcome Date GA Lbr Len/2nd Weight Sex Delivery Anes PTL Lv  4 Current           3 Term           2 SAB           1 SAB             Medications Prior to Admission  Medication Sig Dispense Refill Last Dose   Prenatal Vit-Fe Fumarate-FA (MULTIVITAMIN-PRENATAL) 27-0.8 MG TABS tablet Take 1 tablet by mouth daily at 12 noon.   01/01/2022     OBJECTIVE  Nursing Evaluation:   BP (!) 132/91   Pulse 90   Temp 98 F (36.7 C) (Oral)   Resp 18   LMP 03/30/2021    Findings:   Reactive NST          Negative pre eclampsia labs       NST was performed and has been reviewed by me.  NST INTERPRETATION: Category I  Mode: External Baseline Rate (A): 135 bpm Variability: Moderate Accelerations: 15 x 15 Decelerations: Early     Contraction Frequency (min): 3-8/UI  Reflexes 2+ bilaterally, negative clonus Trace swelling SVE 1cm , long , thick, ballotable.   ASSESSMENT Impression:  1.  Pregnancy:  05/28/2021 at [redacted]w[redacted]d , EDD Estimated Date of Delivery: 01/04/22 2.  Reassuring fetal and maternal status 3.  Negative for pre eclampsia  , one elevated bp on arrival   PLAN 1. Current condition and above findings reviewed.  Reassuring fetal and maternal condition. Reviewed with Dr. 01/06/22 , plan to schedule for induction next week. Follow up in the office on Monday for BP check/NST and ROB.  2. Discharge home with standard labor precautions given to return to L&D or call the office  for problems. Reviewed signs and symptoms of pre eclampsia.  3. Continue routine prenatal care.    I evaluated this patient in person.   Sunday, CNM

## 2022-01-02 NOTE — Progress Notes (Signed)
Discharge instructions provided to patient. Patient verbalized understanding. Pt educated on signs and symptoms of labor, vaginal bleeding, LOF, and fetal movement. Red flag signs reviewed by RN. Patient discharged home with significant other in stable condition.  

## 2022-01-06 ENCOUNTER — Encounter: Payer: Self-pay | Admitting: Obstetrics and Gynecology

## 2022-01-06 ENCOUNTER — Inpatient Hospital Stay
Admission: EM | Admit: 2022-01-06 | Discharge: 2022-01-08 | DRG: 768 | Disposition: A | Discharge status: Home / Self Care | Payer: BLUE CROSS/BLUE SHIELD | Source: Ambulatory Visit | Attending: Obstetrics and Gynecology | Admitting: Obstetrics and Gynecology

## 2022-01-06 ENCOUNTER — Other Ambulatory Visit: Payer: Self-pay

## 2022-01-06 DIAGNOSIS — O99214 Obesity complicating childbirth: Secondary | ICD-10-CM | POA: Diagnosis present

## 2022-01-06 DIAGNOSIS — D62 Acute posthemorrhagic anemia: Secondary | ICD-10-CM | POA: Diagnosis not present

## 2022-01-06 DIAGNOSIS — Z3A40 40 weeks gestation of pregnancy: Secondary | ICD-10-CM

## 2022-01-06 DIAGNOSIS — O9081 Anemia of the puerperium: Secondary | ICD-10-CM | POA: Diagnosis not present

## 2022-01-06 MED ORDER — LIDOCAINE HCL (PF) 1 % IJ SOLN
INTRAMUSCULAR | Status: AC
  Filled 2022-01-06: qty 30

## 2022-01-06 MED ORDER — AMMONIA AROMATIC IN INHA
RESPIRATORY_TRACT | Status: AC
  Filled 2022-01-06: qty 10

## 2022-01-06 MED ORDER — OXYTOCIN 10 UNIT/ML IJ SOLN
INTRAMUSCULAR | Status: AC
  Filled 2022-01-06: qty 2

## 2022-01-06 MED ORDER — MISOPROSTOL 200 MCG PO TABS
ORAL_TABLET | ORAL | Status: AC
  Filled 2022-01-06: qty 1

## 2022-01-07 ENCOUNTER — Encounter: Payer: Self-pay | Admitting: Obstetrics

## 2022-01-07 ENCOUNTER — Inpatient Hospital Stay: Payer: BLUE CROSS/BLUE SHIELD | Admitting: Anesthesiology

## 2022-01-07 ENCOUNTER — Other Ambulatory Visit: Payer: BLUE CROSS/BLUE SHIELD

## 2022-01-07 ENCOUNTER — Encounter: Payer: BLUE CROSS/BLUE SHIELD | Admitting: Obstetrics

## 2022-01-07 DIAGNOSIS — D62 Acute posthemorrhagic anemia: Secondary | ICD-10-CM

## 2022-01-07 DIAGNOSIS — Z3A40 40 weeks gestation of pregnancy: Secondary | ICD-10-CM | POA: Diagnosis not present

## 2022-01-07 DIAGNOSIS — O99214 Obesity complicating childbirth: Secondary | ICD-10-CM | POA: Diagnosis present

## 2022-01-07 DIAGNOSIS — O26893 Other specified pregnancy related conditions, third trimester: Secondary | ICD-10-CM | POA: Diagnosis present

## 2022-01-07 DIAGNOSIS — O9903 Anemia complicating the puerperium: Secondary | ICD-10-CM | POA: Diagnosis not present

## 2022-01-07 DIAGNOSIS — O9081 Anemia of the puerperium: Secondary | ICD-10-CM | POA: Diagnosis not present

## 2022-01-07 DIAGNOSIS — O480 Post-term pregnancy: Secondary | ICD-10-CM

## 2022-01-07 LAB — CBC
HCT: 35.3 % — ABNORMAL LOW (ref 36.0–46.0)
Hemoglobin: 11.9 g/dL — ABNORMAL LOW (ref 12.0–15.0)
MCH: 29.4 pg (ref 26.0–34.0)
MCHC: 33.7 g/dL (ref 30.0–36.0)
MCV: 87.2 fL (ref 80.0–100.0)
Platelets: 254 10*3/uL (ref 150–400)
RBC: 4.05 MIL/uL (ref 3.87–5.11)
RDW: 13.3 % (ref 11.5–15.5)
WBC: 17.8 10*3/uL — ABNORMAL HIGH (ref 4.0–10.5)
nRBC: 0 % (ref 0.0–0.2)

## 2022-01-07 LAB — TYPE AND SCREEN
ABO/RH(D): A POS
Antibody Screen: NEGATIVE

## 2022-01-07 LAB — RPR: RPR Ser Ql: NONREACTIVE

## 2022-01-07 MED ORDER — SODIUM CHLORIDE 0.9 % IV SOLN
INTRAVENOUS | Status: DC | PRN
  Administered 2022-01-07: 5 mL via EPIDURAL
  Administered 2022-01-07: 3 mL via EPIDURAL

## 2022-01-07 MED ORDER — PRENATAL MULTIVITAMIN CH
1.0000 | ORAL_TABLET | Freq: Every day | ORAL | Status: DC
  Administered 2022-01-08: 1 via ORAL
  Filled 2022-01-07: qty 1

## 2022-01-07 MED ORDER — EPHEDRINE 5 MG/ML INJ: 10.0000 mg | INTRAVENOUS | Status: DC | PRN

## 2022-01-07 MED ORDER — OXYTOCIN-SODIUM CHLORIDE 30-0.9 UT/500ML-% IV SOLN
1.0000 m[IU]/min | INTRAVENOUS | Status: DC
  Administered 2022-01-07: 2 m[IU]/min via INTRAVENOUS

## 2022-01-07 MED ORDER — LACTATED RINGERS IV SOLN
500.0000 mL | INTRAVENOUS | Status: DC | PRN
  Administered 2022-01-07: 500 mL via INTRAVENOUS

## 2022-01-07 MED ORDER — TERBUTALINE SULFATE 1 MG/ML IJ SOLN: 0.2500 mg | Freq: Once | INTRAMUSCULAR | Status: DC | PRN

## 2022-01-07 MED ORDER — LIDOCAINE HCL (PF) 1 % IJ SOLN: 30.0000 mL | INTRAMUSCULAR | Status: DC | PRN

## 2022-01-07 MED ORDER — COCONUT OIL OIL: 1.0000 | TOPICAL_OIL | Status: DC | PRN

## 2022-01-07 MED ORDER — ONDANSETRON HCL 4 MG PO TABS: 4.0000 mg | ORAL_TABLET | ORAL | Status: DC | PRN

## 2022-01-07 MED ORDER — ONDANSETRON HCL 4 MG/2ML IJ SOLN: 4.0000 mg | Freq: Four times a day (QID) | INTRAMUSCULAR | Status: DC | PRN

## 2022-01-07 MED ORDER — PHENYLEPHRINE 80 MCG/ML (10ML) SYRINGE FOR IV PUSH (FOR BLOOD PRESSURE SUPPORT)
80.0000 ug | PREFILLED_SYRINGE | INTRAVENOUS | Status: DC | PRN
  Administered 2022-01-07: 80 ug via INTRAVENOUS

## 2022-01-07 MED ORDER — DIPHENHYDRAMINE HCL 50 MG/ML IJ SOLN: 12.5000 mg | INTRAMUSCULAR | Status: DC | PRN

## 2022-01-07 MED ORDER — ONDANSETRON HCL 4 MG/2ML IJ SOLN: 4.0000 mg | INTRAMUSCULAR | Status: DC | PRN

## 2022-01-07 MED ORDER — BENZOCAINE-MENTHOL 20-0.5 % EX AERO
1.0000 | INHALATION_SPRAY | CUTANEOUS | Status: DC | PRN
  Filled 2022-01-07: qty 56

## 2022-01-07 MED ORDER — ACETAMINOPHEN 325 MG PO TABS: 650.0000 mg | ORAL_TABLET | ORAL | Status: DC | PRN

## 2022-01-07 MED ORDER — LIDOCAINE-EPINEPHRINE (PF) 1.5 %-1:200000 IJ SOLN
INTRAMUSCULAR | Status: DC | PRN
  Administered 2022-01-07: 4 mL via EPIDURAL

## 2022-01-07 MED ORDER — SIMETHICONE 80 MG PO CHEW: 80.0000 mg | CHEWABLE_TABLET | ORAL | Status: DC | PRN

## 2022-01-07 MED ORDER — OXYCODONE-ACETAMINOPHEN 5-325 MG PO TABS: 1.0000 | ORAL_TABLET | ORAL | Status: DC | PRN

## 2022-01-07 MED ORDER — LACTATED RINGERS IV SOLN
500.0000 mL | Freq: Once | INTRAVENOUS | Status: AC
  Administered 2022-01-07: 500 mL via INTRAVENOUS

## 2022-01-07 MED ORDER — OXYCODONE-ACETAMINOPHEN 5-325 MG PO TABS: 2.0000 | ORAL_TABLET | ORAL | Status: DC | PRN

## 2022-01-07 MED ORDER — DOCUSATE SODIUM 100 MG PO CAPS
100.0000 mg | ORAL_CAPSULE | Freq: Two times a day (BID) | ORAL | Status: DC
  Administered 2022-01-07 – 2022-01-08 (×2): 100 mg via ORAL
  Filled 2022-01-07 (×2): qty 1

## 2022-01-07 MED ORDER — PHENYLEPHRINE 80 MCG/ML (10ML) SYRINGE FOR IV PUSH (FOR BLOOD PRESSURE SUPPORT)
80.0000 ug | PREFILLED_SYRINGE | INTRAVENOUS | Status: DC | PRN
  Filled 2022-01-07: qty 10

## 2022-01-07 MED ORDER — IBUPROFEN 600 MG PO TABS
600.0000 mg | ORAL_TABLET | Freq: Four times a day (QID) | ORAL | Status: DC
  Administered 2022-01-07 – 2022-01-08 (×4): 600 mg via ORAL
  Filled 2022-01-07 (×4): qty 1

## 2022-01-07 MED ORDER — LIDOCAINE HCL (PF) 1 % IJ SOLN
INTRAMUSCULAR | Status: DC | PRN
  Administered 2022-01-07: 4 mL via SUBCUTANEOUS

## 2022-01-07 MED ORDER — WITCH HAZEL-GLYCERIN EX PADS: 1.0000 | MEDICATED_PAD | CUTANEOUS | Status: DC | PRN

## 2022-01-07 MED ORDER — LACTATED RINGERS IV SOLN: INTRAVENOUS | Status: DC

## 2022-01-07 MED ORDER — DIBUCAINE (PERIANAL) 1 % EX OINT: 1.0000 | TOPICAL_OINTMENT | CUTANEOUS | Status: DC | PRN

## 2022-01-07 MED ORDER — DIPHENHYDRAMINE HCL 25 MG PO CAPS: 25.0000 mg | ORAL_CAPSULE | Freq: Four times a day (QID) | ORAL | Status: DC | PRN

## 2022-01-07 MED ORDER — OXYTOCIN-SODIUM CHLORIDE 30-0.9 UT/500ML-% IV SOLN
2.5000 [IU]/h | INTRAVENOUS | Status: DC
  Administered 2022-01-07: 2.5 [IU]/h via INTRAVENOUS
  Filled 2022-01-07: qty 500

## 2022-01-07 MED ORDER — FENTANYL-BUPIVACAINE-NACL 0.5-0.125-0.9 MG/250ML-% EP SOLN
12.0000 mL/h | EPIDURAL | Status: DC | PRN
  Administered 2022-01-07: 12 mL/h via EPIDURAL

## 2022-01-07 MED ORDER — FENTANYL-BUPIVACAINE-NACL 0.5-0.125-0.9 MG/250ML-% EP SOLN
EPIDURAL | Status: AC
  Filled 2022-01-07: qty 250

## 2022-01-07 MED ORDER — SOD CITRATE-CITRIC ACID 500-334 MG/5ML PO SOLN: 30.0000 mL | ORAL | Status: DC | PRN

## 2022-01-07 MED ORDER — ZOLPIDEM TARTRATE 5 MG PO TABS: 5.0000 mg | ORAL_TABLET | Freq: Every evening | ORAL | Status: DC | PRN

## 2022-01-07 MED ORDER — OXYTOCIN BOLUS FROM INFUSION
333.0000 mL | Freq: Once | INTRAVENOUS | Status: AC
  Administered 2022-01-07: 333 mL via INTRAVENOUS

## 2022-01-07 MED ORDER — ACETAMINOPHEN 500 MG PO TABS
1000.0000 mg | ORAL_TABLET | Freq: Four times a day (QID) | ORAL | Status: DC
  Administered 2022-01-07 – 2022-01-08 (×4): 1000 mg via ORAL
  Filled 2022-01-07 (×4): qty 2

## 2022-01-07 NOTE — Discharge Summary (Signed)
Obstetrical Discharge Summary  Date of Admission: 01/06/2022 Date of Discharge: 01/08/2022  Primary OB: [facility]   Gestational Age at Delivery: [redacted]w[redacted]d   Antepartum complications: none Reason for Admission: labor Date of Delivery: 01/07/2022  Delivered By: Siri Cole, CNM  Delivery Type: spontaneous vaginal delivery Intrapartum complications/course: PPH Anesthesia: epidural Placenta: Delivered and expressed via active management. Intact: yes. To pathology: no.  Laceration: 3rd degree Episiotomy: none EBL: Baby: Liveborn female, APGARs 8/9, weight 3770 g.    Discharge Diagnosis: Delivered.   Postpartum course: she is tolerating regular diet, her pain is controlled with PO medication, she is ambulating and voiding without difficulty.  Discharge Vital Signs:  Current Vital Signs 24h Vital Sign Ranges  T 98.8 F (37.1 C) Temp  Avg: 98.4 F (36.9 C)  Min: 97.7 F (36.5 C)  Max: 98.8 F (37.1 C)  BP 136/86 BP  Min: 115/52  Max: 146/47  HR 81 Pulse  Avg: 98.1  Min: 80  Max: 151  RR 20 Resp  Avg: 18.7  Min: 18  Max: 20  SaO2 98 % Room Air SpO2  Avg: 98.3 %  Min: 97 %  Max: 100 %       24 Hour I/O Current Shift I/O  Time Ins Outs 11/20 0701 - 11/21 0700 In: -  Out: 2280 [Urine:1425] No intake/output data recorded.     Patient Vitals for the past 6 hrs:  BP Temp Temp src Pulse Resp SpO2  01/08/22 1615 136/86 98.8 F (37.1 C) Axillary 81 20 98 %    Discharge Exam:  NAD Perineum: 3rd degree repaired Abdomen: firm fundus below the umbilicus, NTTP, non distended, +bowel sounds.  Incision NA RRR no MRGs CTAB Ext: no c/c/e  Recent Labs  Lab 01/02/22 1624 01/07/22 0012 01/08/22 0620  WBC 10.3 17.8* 12.7*  HGB 11.7* 11.9* 8.6*  HCT 34.5* 35.3* 25.5*  PLT 222 254 171    Disposition: Home  Rh Immune globulin given: no Rubella vaccine given: no Tdap vaccine given in AP or PP setting: yes Flu vaccine given in AP or PP setting: no  Contraception:  undecided; considering IUD vs vasectomy  Prenatal/Postnatal Panel: A POS//Rubella Immune//Varicella Immune//RPR negative//HIV negative/HepB Surface Ag negative//pap no abnormalities (date: 05/2021)//plans to breastfeed  Plan:  Reece Levy was discharged to home in good condition. Follow-up appointment with LMD in 2 and 6 weeks for a PP visit  No future appointments.  Discharge Medications: Allergies as of 01/08/2022   No Known Allergies      Medication List     TAKE these medications    multivitamin-prenatal 27-0.8 MG Tabs tablet Take 1 tablet by mouth daily at 12 noon.       Parke Poisson, CNM [facility] 01/08/2022, 5:14 PM

## 2022-01-07 NOTE — Anesthesia Preprocedure Evaluation (Signed)
Anesthesia Evaluation  Patient identified by MRN, date of birth, ID band Patient awake    Reviewed: Allergy & Precautions, H&P , NPO status , Patient's Chart, lab work & pertinent test results, reviewed documented beta blocker date and time   Airway Mallampati: II  TM Distance: >3 FB Neck ROM: full    Dental no notable dental hx. (+) Teeth Intact   Pulmonary neg pulmonary ROS   Pulmonary exam normal breath sounds clear to auscultation       Cardiovascular Exercise Tolerance: Good negative cardio ROS  Rhythm:regular Rate:Normal     Neuro/Psych negative neurological ROS  negative psych ROS   GI/Hepatic negative GI ROS, Neg liver ROS,,,  Endo/Other  negative endocrine ROSdiabetes, Gestational    Renal/GU      Musculoskeletal   Abdominal   Peds  Hematology negative hematology ROS (+)   Anesthesia Other Findings   Reproductive/Obstetrics (+) Pregnancy                             Anesthesia Physical Anesthesia Plan  ASA: 2  Anesthesia Plan: Epidural   Post-op Pain Management:    Induction:   PONV Risk Score and Plan:   Airway Management Planned:   Additional Equipment:   Intra-op Plan:   Post-operative Plan:   Informed Consent: I have reviewed the patients History and Physical, chart, labs and discussed the procedure including the risks, benefits and alternatives for the proposed anesthesia with the patient or authorized representative who has indicated his/her understanding and acceptance.       Plan Discussed with:   Anesthesia Plan Comments:        Anesthesia Quick Evaluation

## 2022-01-07 NOTE — Progress Notes (Signed)
Michelle Holden is a 31 y.o. [phone] at [redacted]w[redacted]d by ultrasound admitted for active labor  Subjective: she has rested for several hours after receiving an epidural. Her legs are very heavy. She denies any sense of SROM.    Objective: BP (!) 128/93   Pulse 86   Temp 98.4 F (36.9 C) (Oral)   Resp 16   Ht 5\' 6"  (1.676 m)   Wt 97.5 kg   LMP 03/30/2021   SpO2 96%   BMI 34.70 kg/m  No intake/output data recorded. No intake/output data recorded.  FHT:  FHR: 130 bpm, variability: moderate,  accelerations:  Present,  decelerations:  Absent UC:   regular, every 6 minutes SVE:   Dilation: 7 Effacement (%): 100 Station: Ballotable Exam by:: [email or web address] CNM She has a large forebag. Cervix is very soft; more cervix felt on her left side than on the right.  Labs: Lab Results  Component Value Date   WBC 17.8 (H) 01/07/2022   HGB 11.9 (L) 01/07/2022   HCT 35.3 (L) 01/07/2022   MCV 87.2 01/07/2022   PLT 254 01/07/2022    Assessment / Plan: Spontaneous labor, progressing normally  Labor: Progressing normally. Her contractions need augmentation or AROM; baby's position still somewhat high.  Fetal Wellbeing:  Category I Pain Control:  Epidural I/D:  n/a Anticipated MOD:  NSVD Pateint's BP addressesd with IV ephedrine. Will use the "peanut ball" and positioning to encourage fetal descent.consider pitocin augmentation as other option.    Dr. 01/09/2022 updated on the patient's progress.  Feliberto Gottron, CNM  01/07/2022 7:54 AM   01/09/2022, CNM 01/07/2022, 7:49 AM

## 2022-01-07 NOTE — Anesthesia Procedure Notes (Signed)
Epidural Patient location during procedure: OB End time: 01/07/2022 1:15 AM  Staffing Anesthesiologist: Yevette Edwards, MD Performed: anesthesiologist   Preanesthetic Checklist Completed: patient identified, IV checked, site marked, risks and benefits discussed, surgical consent, monitors and equipment checked, pre-op evaluation and timeout performed  Epidural Patient position: sitting Prep: Betadine Patient monitoring: heart rate, continuous pulse ox and blood pressure Approach: midline Location: L4-L5 Injection technique: LOR saline  Needle:  Needle type: Tuohy  Needle gauge: 17 G Needle length: 9 cm and 9 Needle insertion depth: 6 cm Catheter type: closed end flexible Catheter size: 19 Gauge Catheter at skin depth: 12 cm Test dose: negative and 1.5% lidocaine with Epi 1:200 K  Assessment Events: blood not aspirated, injection not painful, no injection resistance, no paresthesia and negative IV test  Additional Notes   Patient tolerated the insertion well without complications.Reason for block:procedure for pain

## 2022-01-07 NOTE — Progress Notes (Signed)
   Subjective:  Resting comfortably with epidural. Husband and sister at her side.   Objective:   Vitals: Blood pressure (!) 122/59, pulse 92, temperature 98.4 F (36.9 C), temperature source Oral, resp. rate 16, height 5\' 6"  (1.676 m), weight 97.5 kg, last menstrual period 03/30/2021, SpO2 100 %. General: NED Abdomen: Cervical Exam:  Dilation: 7 Effacement (%): 100 Cervical Position: Middle Station: Ballotable Presentation: Vertex Exam by:: [email or web address] CNM  FHT: baseline 135, moderate variability, pos accel, neg decel  Toco:q 1-6 Pitocin at 6 milli units  Results for orders placed or performed during the hospital encounter of 01/06/22 (from the past 24 hour(s))  CBC     Status: Abnormal   Collection Time: 01/07/22 12:12 AM  Result Value Ref Range   WBC 17.8 (H) 4.0 - 10.5 K/uL   RBC 4.05 3.87 - 5.11 MIL/uL   Hemoglobin 11.9 (L) 12.0 - 15.0 g/dL   HCT 01/09/22 (L) 16.1 - 09.6 %   MCV 87.2 80.0 - 100.0 fL   MCH 29.4 26.0 - 34.0 pg   MCHC 33.7 30.0 - 36.0 g/dL   RDW 04.5 40.9 - 81.1 %   Platelets 254 150 - 400 K/uL   nRBC 0.0 0.0 - 0.2 %  Type and screen [facility]     Status: None   Collection Time: 01/07/22 12:12 AM  Result Value Ref Range   ABO/RH(D) A POS    Antibody Screen NEG    Sample Expiration      01/10/2022,2359 Performed at [facility], [address] [place], [place]   RPR     Status: None   Collection Time: 01/07/22 12:12 AM  Result Value Ref Range   RPR Ser Ql NON REACTIVE NON REACTIVE    Assessment:   31 y.o. 26 [redacted]w[redacted]d admitted in labor   Plan:   1) Labor -Pitocin started for augmentation, Consider AROM when once fetus descends   2) Fetus - category I tracing   3)  GBS negative, membranes intact.   4) Pain management: Epidural managed by Anesthesia  [redacted]w[redacted]d, CNM   [facility]  01/07/2022 10:23 AM '

## 2022-01-07 NOTE — Progress Notes (Signed)
   Subjective:  Comfortable with epidural. Feels rectal pressure. Reports feeling anxious whenever she is in an upright position. Her heart rate increase and she gets nauseous so prefers to be reclined.   Objective:   Vitals: Blood pressure 128/71, pulse 97, temperature 98.6 F (37 C), temperature source Oral, resp. rate 16, height 5\' 6"  (1.676 m), weight 97.5 kg, last menstrual period 03/30/2021, SpO2 98 %. General: NAD Abdomen: Cervical Exam:  Dilation: Lip/rim Effacement (%): 100 Cervical Position: Middle Station: 0 Presentation: Vertex Exam by:: L Wanda Rideout CNM  FHT: baseline 135, moderate variability, pos accel , neg decel Toco:q 2-3 Pitocin at 12 milli units   Results for orders placed or performed during the hospital encounter of 01/06/22 (from the past 24 hour(s))  CBC     Status: Abnormal   Collection Time: 01/07/22 12:12 AM  Result Value Ref Range   WBC 17.8 (H) 4.0 - 10.5 K/uL   RBC 4.05 3.87 - 5.11 MIL/uL   Hemoglobin 11.9 (L) 12.0 - 15.0 g/dL   HCT 01/09/22 (L) 62.1 - 30.8 %   MCV 87.2 80.0 - 100.0 fL   MCH 29.4 26.0 - 34.0 pg   MCHC 33.7 30.0 - 36.0 g/dL   RDW 65.7 84.6 - 96.2 %   Platelets 254 150 - 400 K/uL   nRBC 0.0 0.0 - 0.2 %  Type and screen [facility]     Status: None   Collection Time: 01/07/22 12:12 AM  Result Value Ref Range   ABO/RH(D) A POS    Antibody Screen NEG    Sample Expiration      01/10/2022,2359 Performed at [facility], [address] [place], [place]   RPR     Status: None   Collection Time: 01/07/22 12:12 AM  Result Value Ref Range   RPR Ser Ql NON REACTIVE NON REACTIVE    Assessment:   31 y.o. 26 [redacted]w[redacted]d admitted in labor   Plan:   1) Labor -Pitocin infusing for augmentaion, AROM for scant clear fluid at 1223. Now 9.5/100/0, will reposition and wait for pt to have a stronger  urge to push.    2) Fetus - category I tracing    3)  GBS negative, membranes AROM at 1223   4) Pain  management: Epidural managed by Anesthesia  09-18-1967, CNM  [facility]  01/07/2022 3:48 PM

## 2022-01-07 NOTE — Progress Notes (Signed)
   Subjective:  Pain managed with Epidural, but has some discomforts related to position. Open to AROM   Objective:   Vitals: Blood pressure (!) 122/59, pulse 92, temperature 98.4 F (36.9 C), temperature source Oral, resp. rate 16, height 5\' 6"  (1.676 m), weight 97.5 kg, last menstrual period 03/30/2021, SpO2 100 %. General: NAD Abdomen:non tender  Cervical Exam:  Dilation: 9 Effacement (%): 100 Cervical Position: Middle Station: -1 Presentation: Vertex Exam by:: L Sahvanna Mcmanigal CNM  FHT: baseline 135 moderate variability, pos accel neg decel  Toco:q 1.5-3.5 Pitocin at 10 milli-units   Results for orders placed or performed during the hospital encounter of 01/06/22 (from the past 24 hour(s))  CBC     Status: Abnormal   Collection Time: 01/07/22 12:12 AM  Result Value Ref Range   WBC 17.8 (H) 4.0 - 10.5 K/uL   RBC 4.05 3.87 - 5.11 MIL/uL   Hemoglobin 11.9 (L) 12.0 - 15.0 g/dL   HCT 01/09/22 (L) 64.3 - 32.9 %   MCV 87.2 80.0 - 100.0 fL   MCH 29.4 26.0 - 34.0 pg   MCHC 33.7 30.0 - 36.0 g/dL   RDW 51.8 84.1 - 66.0 %   Platelets 254 150 - 400 K/uL   nRBC 0.0 0.0 - 0.2 %  Type and screen [facility]     Status: None   Collection Time: 01/07/22 12:12 AM  Result Value Ref Range   ABO/RH(D) A POS    Antibody Screen NEG    Sample Expiration      01/10/2022,2359 Performed at [facility], [address] [place], [place]   RPR     Status: None   Collection Time: 01/07/22 12:12 AM  Result Value Ref Range   RPR Ser Ql NON REACTIVE NON REACTIVE    Assessment:   31 y.o. 30 [redacted]w[redacted]d admitted in labor  Plan:   1) Labor -Pitocin infusing for augmentaion, AROM for scant clear fluid at 1223. Now 9/100/-1, will reposition and wait for pt to have urge to push.    2) Fetus - category I tracing    3)  GBS negative, membranes AROM at 1223   4) Pain management: Epidural managed by Anesthesia   [redacted]w[redacted]d, CNM   [facility]   01/07/2022 2:13 PM

## 2022-01-07 NOTE — H&P (Signed)
Michelle Holden is a 31 y.o. female presenting for  regular contractions since 1800  earlier this evening.  She denies any LOF or vaginal bleeding. Has been observed over the last two hours on Labor and Delviery and has made cervical change from 2.5 to 4 cms.She is supported by her husband and her sister. OB History     Gravida  4   Para  1   Term  1   Preterm      AB  2   Living  1      SAB  2   IAB      Ectopic      Multiple      Live Births             Past Medical History:  Diagnosis Date   Miscarriage    Past Surgical History:  Procedure Laterality Date   DILATION AND EVACUATION N/A 12/29/2020   Procedure: DILATATION AND EVACUATION;  Surgeon: Vena Austria, MD;  Location: [facility] ORS;  Service: Gynecology;  Laterality: N/A;   Family History: family history includes Diabetes in her maternal aunt. Social History:  reports that she has never smoked. She has never used smokeless tobacco. She reports that she does not currently use alcohol. She reports that she does not use drugs.     Maternal Diabetes: No Genetic Screening: Normal Maternal Ultrasounds/Referrals: Normal Fetal Ultrasounds or other Referrals:  None Maternal Substance Abuse:  No Significant Maternal Medications:  None Significant Maternal Lab Results:  Group B Strep negative Number of Prenatal Visits:greater than 3 verified prenatal visits Other Comments:  None  Review of Systems History Dilation: 4 Effacement (%): 80 Station: -3 Exam by:: M Amere Iott CNM Blood pressure 131/89, pulse 89, temperature 98.1 F (36.7 C), temperature source Oral, height 5\' 6"  (1.676 m), weight 97.5 kg, last menstrual period 03/30/2021. Maternal Exam:  Uterine Assessment: Contraction strength is moderate.  Contraction duration is 60 seconds. Contraction frequency is regular.  Abdomen: Fetal presentation: vertex Introitus: Normal vulva. Normal vagina.  Pelvis: adequate for delivery.   Cervix: Cervix evaluated by  digital exam.     Physical Exam Constitutional:      Appearance: Normal appearance. She is obese.  HENT:     Head: Normocephalic and atraumatic.  Cardiovascular:     Rate and Rhythm: Normal rate and regular rhythm.     Pulses: Normal pulses.     Heart sounds: Normal heart sounds.  Pulmonary:     Effort: Pulmonary effort is normal.     Breath sounds: Normal breath sounds.  Abdominal:     Comments: Gravid abdomen.  Genitourinary:    General: Normal vulva.     Rectum: Normal.     Comments: SVE: 4 cms/80% effacement/-3station, Intact. Musculoskeletal:        General: Normal range of motion.     Cervical back: Normal range of motion and neck supple.  Skin:    General: Skin is warm and dry.  Neurological:     General: No focal deficit present.     Mental Status: She is alert and oriented to person, place, and time.  Psychiatric:        Mood and Affect: Mood normal.        Behavior: Behavior normal.     Prenatal labs: ABO, Rh: A/Positive/-- (04/27 1429) Antibody: Negative (04/27 1429) Rubella: 5.69 (04/27 1429) RPR: Non Reactive (08/31 0942)  HBsAg: Negative (04/27 1429)  HIV: Non Reactive (08/31 0942)  GBS: Negative/-- (10/23  1034)   Assessment/Plan: IUP 40 weeks 3 days Regular contractions Category 1 FHTs GBS negative; A+, RI, VI  Admit to Labor and Delviery Routine orders, including IV. Anesthesia once admitted for Epidural Continuous EFM Will recheck in several hours once comfortable and has rested. Anticipate SVD. Dr. Arvella Merles notified via text of the admission.  Mirna Mires, CNM  01/07/2022 12:20 AM     Mirna Mires 01/07/2022, 12:13 AM

## 2022-01-07 NOTE — Progress Notes (Signed)
OB/GYN Attending Progress Note  Called by nurse due to concerns for possible 3rd degree laceration after vaginal delivery

## 2022-01-08 LAB — CBC
HCT: 25.5 % — ABNORMAL LOW (ref 36.0–46.0)
Hemoglobin: 8.6 g/dL — ABNORMAL LOW (ref 12.0–15.0)
MCH: 30.3 pg (ref 26.0–34.0)
MCHC: 33.7 g/dL (ref 30.0–36.0)
MCV: 89.8 fL (ref 80.0–100.0)
Platelets: 171 10*3/uL (ref 150–400)
RBC: 2.84 MIL/uL — ABNORMAL LOW (ref 3.87–5.11)
RDW: 13.8 % (ref 11.5–15.5)
WBC: 12.7 10*3/uL — ABNORMAL HIGH (ref 4.0–10.5)
nRBC: 0 % (ref 0.0–0.2)

## 2022-01-08 NOTE — Progress Notes (Signed)
Subjective:  Doing well postpartum day 1; she is tolerating regular diet, her pain is controlled with PO medication, she is ambulating and voiding without difficulty. She reports breastfeeding is going well. Newborn is skin to skin currently with some temperature regulation issues. She would like to discharge to home later if all is well.   Objective:  Vital signs in last 24 hours: Temp:  [97.7 F (36.5 C)-98.8 F (37.1 C)] 98.7 F (37.1 C) (11/21 0813) Pulse Rate:  [78-151] 89 (11/21 0813) Resp:  [18-20] 20 (11/21 0813) BP: (114-146)/(47-88) 126/84 (11/21 0813) SpO2:  [95 %-100 %] 97 % (11/21 0813)    General: NAD Pulmonary: no increased work of breathing Abdomen: non-distended, non-tender, fundus firm at level of umbilicus Extremities: no edema, no erythema, no tenderness  Results for orders placed or performed during the hospital encounter of 01/06/22 (from the past 72 hour(s))  CBC     Status: Abnormal   Collection Time: 01/07/22 12:12 AM  Result Value Ref Range   WBC 17.8 (H) 4.0 - 10.5 K/uL   RBC 4.05 3.87 - 5.11 MIL/uL   Hemoglobin 11.9 (L) 12.0 - 15.0 g/dL   HCT 68.3 (L) 41.9 - 62.2 %   MCV 87.2 80.0 - 100.0 fL   MCH 29.4 26.0 - 34.0 pg   MCHC 33.7 30.0 - 36.0 g/dL   RDW 29.7 98.9 - 21.1 %   Platelets 254 150 - 400 K/uL   nRBC 0.0 0.0 - 0.2 %    Comment: Performed at [facility], [address] [place], [place] [address]  Type and screen [facility]     Status: None   Collection Time: 01/07/22 12:12 AM  Result Value Ref Range   ABO/RH(D) A POS    Antibody Screen NEG    Sample Expiration      01/10/2022,2359 Performed at [facility], [address] [place], [place] [address]   RPR     Status: None   Collection Time: 01/07/22 12:12 AM  Result Value Ref Range   RPR Ser Ql NON REACTIVE NON REACTIVE    Comment: Performed at [facility], [address], [place], [place] [address]  CBC     Status: Abnormal    Collection Time: 01/08/22  6:20 AM  Result Value Ref Range   WBC 12.7 (H) 4.0 - 10.5 K/uL   RBC 2.84 (L) 3.87 - 5.11 MIL/uL   Hemoglobin 8.6 (L) 12.0 - 15.0 g/dL    Comment: REPEATED TO VERIFY   HCT 25.5 (L) 36.0 - 46.0 %   MCV 89.8 80.0 - 100.0 fL   MCH 30.3 26.0 - 34.0 pg   MCHC 33.7 30.0 - 36.0 g/dL   RDW 31.4 97.0 - 26.3 %   Platelets 171 150 - 400 K/uL   nRBC 0.0 0.0 - 0.2 %    Comment: Performed at [facility], [address] [place], [place] [address]    Assessment:   31 y.o. [phone] postpartum day # 1, lactating  Plan:    1) Acute blood loss anemia - hemodynamically stable and asymptomatic - po ferrous sulfate  2) Blood Type --/--/A POS (11/20 0012) / Rubella 5.69 (04/27 1429) / Varicella Immune  3) TDAP status  given antepartum  4) Feeding plan: breast  5)  Education given regarding options for contraception, as well as compatibility with breast feeding if applicable.  Patient plans on  undecided  for contraception.  6) Disposition: continue current  care with possible discharge later today pending newborn progress   Tresea Mall, CNM [facility] 01/08/2022, 10:15 AM

## 2022-01-08 NOTE — Anesthesia Postprocedure Evaluation (Signed)
Anesthesia Post Note  Patient: Michelle Holden  Procedure(s) Performed: AN AD HOC LABOR EPIDURAL  Patient location during evaluation: Mother Baby Anesthesia Type: Epidural Level of consciousness: awake, awake and alert and oriented Pain management: pain level controlled Vital Signs Assessment: post-procedure vital signs reviewed and stable Respiratory status: spontaneous breathing, nonlabored ventilation and respiratory function stable Cardiovascular status: blood pressure returned to baseline Postop Assessment: no headache and no backache Anesthetic complications: no   No notable events documented.   Last Vitals:  Vitals:   01/08/22 0310 01/08/22 0813  BP: 136/80 126/84  Pulse: 95 89  Resp: 18 20  Temp: 36.5 C 37.1 C  SpO2: 99% 97%    Last Pain:  Vitals:   01/08/22 0813  TempSrc: Oral  PainSc:                  Ginger Carne

## 2022-01-23 ENCOUNTER — Ambulatory Visit: Payer: BLUE CROSS/BLUE SHIELD | Admitting: Licensed Practical Nurse

## 2022-02-01 ENCOUNTER — Ambulatory Visit (INDEPENDENT_AMBULATORY_CARE_PROVIDER_SITE_OTHER): Payer: BLUE CROSS/BLUE SHIELD | Admitting: Licensed Practical Nurse

## 2022-02-01 DIAGNOSIS — Z1332 Encounter for screening for maternal depression: Secondary | ICD-10-CM | POA: Diagnosis not present

## 2022-02-01 NOTE — Progress Notes (Signed)
Virtual Visit via Telephone Note  I connected with Michelle Holden on 02/01/22 at 10:15 AM EST by telephone and verified that I am speaking with the correct person using two identifiers.  Location: Patient: Michelle Holden, Michelle Holden  Provider: office in [place], [place]    I discussed the limitations, risks, security and privacy concerns of performing an evaluation and management service by telephone and the availability of in person appointments. I also discussed with the patient that there may be a patient responsible charge related to this service. The patient expressed understanding and agreed to proceed.   History of Present Illness: Feeling good Bleeding: spotting now, will goes days without bleeding, then some Perineum: day 4 had some bleeding, feels fine No concerns voiding and stooling Appetite is good Sleep: gets 4 hours stretches, probably 8 hours in 24 hours Mood: has been good, has help  Husband has 16 weeks off  SAHM  Pap 05/2021 neg Breastfeeding is going well Has been going on walks   Observations/Objective:   Assessment and Plan:   Follow Up Instructions:    I discussed the assessment and treatment plan with the patient. The patient was provided an opportunity to ask questions and all were answered. The patient agreed with the plan and demonstrated an understanding of the instructions.   The patient was advised to call back or seek an in-person evaluation if the symptoms worsen or if the condition fails to improve as anticipated.  I provided *** minutes of non-face-to-face time during this encounter.   Ellouise Newer Marvie Brevik, CNM

## 2022-10-14 ENCOUNTER — Telehealth: Payer: Self-pay

## 2022-10-14 NOTE — Telephone Encounter (Signed)
Patient called back. Lump is in her left breast it is not painful, no drainage or swelling. Lump onset x 2-3 weeks. She just felt it one day randomly. Please advise.

## 2022-10-14 NOTE — Telephone Encounter (Signed)
Patient is calling with concerns of having a lump in her left breast while she is breastfeeding 43 month old. I tried to call her back to get more information about her breast, but there was no answer. Please advise.

## 2022-10-22 ENCOUNTER — Other Ambulatory Visit: Payer: Self-pay

## 2022-10-22 ENCOUNTER — Ambulatory Visit: Payer: Managed Care, Other (non HMO)

## 2022-10-22 VITALS — BP 137/88 | HR 90 | Wt 151.3 lb

## 2022-10-22 DIAGNOSIS — N6322 Unspecified lump in the left breast, upper inner quadrant: Secondary | ICD-10-CM | POA: Diagnosis not present

## 2022-10-22 NOTE — Addendum Note (Signed)
Addended by: Fonda Kinder on: 10/22/2022 02:57 PM   Modules accepted: Orders

## 2022-10-22 NOTE — Telephone Encounter (Signed)
The patient has been contacted and scheduled for 2:15 pm today, 9/3 with Autumn Messing.

## 2022-10-22 NOTE — Progress Notes (Signed)
   GYN ENCOUNTER  Encounter for Breast Lump  Subjective  HPI: Michelle Holden is a 32 y.o. [id number] who presents today for evaluation of breast lump.  She noted a lump in her inner left breast approximately two weeks ago. She is currently breastfeeding her 35 month old son. She denies tenderness, swelling, redness, fever, discharge. She has no history of mastitis in this pregnancy. She states that size feels different after she has emptied her breasts, however, she's not sure if that is because it is just less on the surface when her breasts are not full.   Past Medical History:  Diagnosis Date   Miscarriage    Past Surgical History:  Procedure Laterality Date   DILATION AND EVACUATION N/A 12/29/2020   Procedure: DILATATION AND EVACUATION;  Surgeon: Vena Austria, MD;  Location: [facility] ORS;  Service: Gynecology;  Laterality: N/A;   OB History     Gravida  4   Para  2   Term  2   Preterm      AB  2   Living  2      SAB  2   IAB      Ectopic      Multiple  0   Live Births  1          No Known Allergies  Review of Systems  12 point ROS negative except for pertinent positives noted in HPO above.   Objective  Wt 151 lb 4.8 oz (68.6 kg)   Breastfeeding Yes   BMI 24.42 kg/m   Physical examination GENERAL APPEARANCE: alert, well appearing, tearful BREASTS: lactating, mass of approximately 1x2-3cm palpable on inner upper quadrant of left breast just medial to the nipple.  LUNGS: normal work of breathing   Assessment/Plan - Reviewed possible differential diagnoses including clogged milk duct, galactocele, benign breast mass, or more concerning possibilities. - Diagnostic mammogram ordered. Will follow up based on results.   Lindalou Hose Deloss Amico, CNM  10/22/22 2:37 PM

## 2022-11-01 ENCOUNTER — Other Ambulatory Visit: Payer: Self-pay

## 2022-11-01 ENCOUNTER — Ambulatory Visit
Admission: RE | Admit: 2022-11-01 | Discharge: 2022-11-01 | Disposition: A | Discharge status: Home / Self Care | Payer: Managed Care, Other (non HMO) | Source: Ambulatory Visit

## 2022-11-01 DIAGNOSIS — N6489 Other specified disorders of breast: Secondary | ICD-10-CM | POA: Diagnosis not present

## 2022-11-01 DIAGNOSIS — N6322 Unspecified lump in the left breast, upper inner quadrant: Secondary | ICD-10-CM | POA: Insufficient documentation

## 2022-11-01 DIAGNOSIS — N63 Unspecified lump in unspecified breast: Secondary | ICD-10-CM

## 2022-11-01 DIAGNOSIS — R928 Other abnormal and inconclusive findings on diagnostic imaging of breast: Secondary | ICD-10-CM

## 2022-11-01 MED ORDER — LIDOCAINE HCL (PF) 1 % IJ SOLN
2.0000 mL | Freq: Once | INTRAMUSCULAR | Status: AC
  Administered 2022-11-01: 2 mL via INTRADERMAL
  Filled 2022-11-01: qty 2

## 2022-11-01 MED ORDER — LIDOCAINE-EPINEPHRINE 1 %-1:100000 IJ SOLN
8.0000 mL | Freq: Once | INTRAMUSCULAR | Status: AC
  Administered 2022-11-01: 8 mL
  Filled 2022-11-01: qty 8

## 2023-01-14 IMAGING — US US OB < 14 WEEKS - US OB TV
1 of 2 series · 13 of 28 positions shown · non-contrast
Comparison: None.

CLINICAL DATA: Pregnant, vaginal bleeding

EXAM:
OBSTETRIC <14 WK US AND TRANSVAGINAL OB US
TECHNIQUE: Both transabdominal and transvaginal ultrasound examinations were
performed for complete evaluation of the gestation as well as the
maternal uterus, adnexal regions, and pelvic cul-de-sac.
Transvaginal technique was performed to assess early pregnancy.

[Series 1: us ob comp less 14 wks · 13 of 98 slices shown]
[im 4/98]
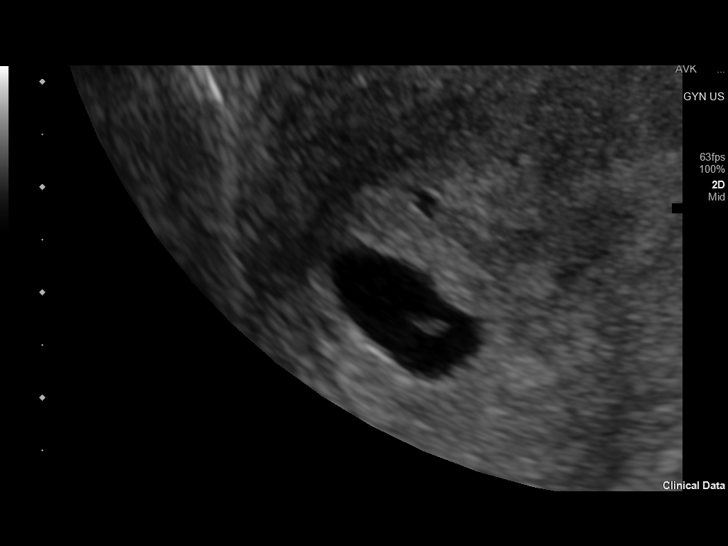
[im 12/98]
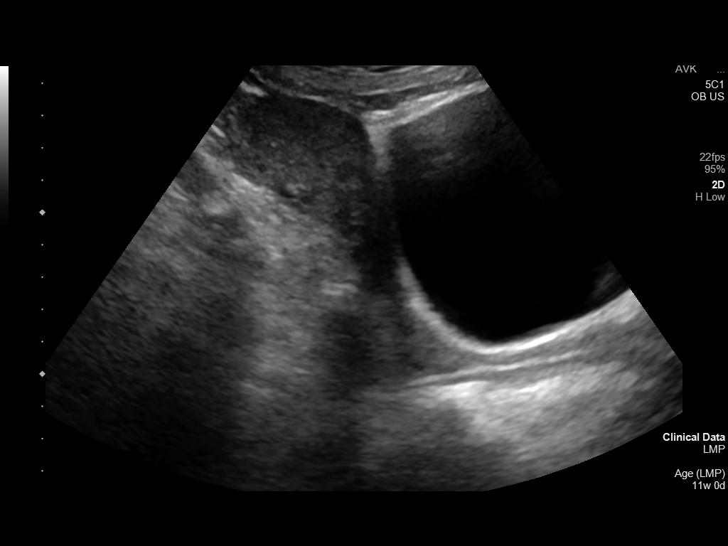
[im 19/98]
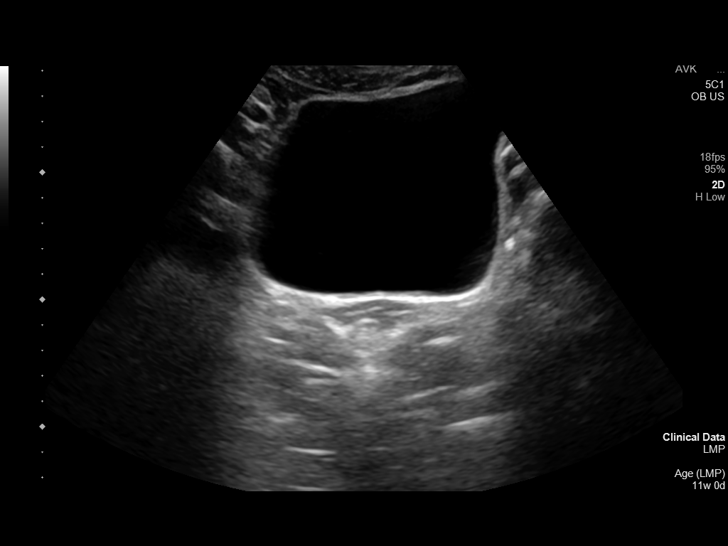
[im 27/98]
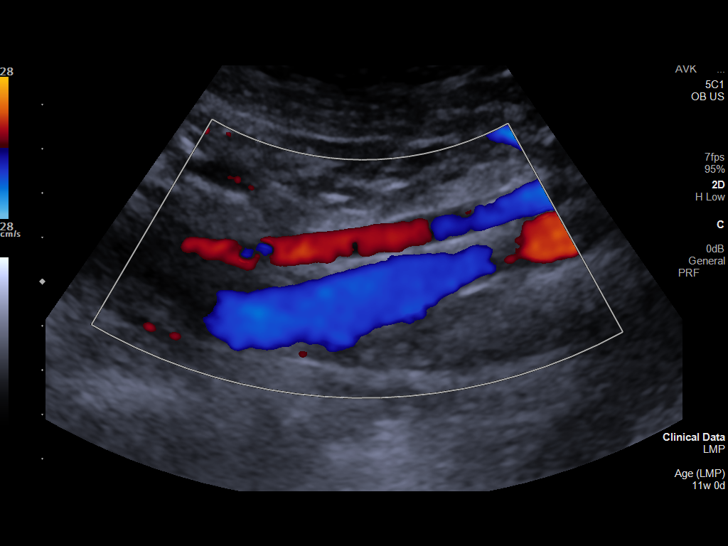
[im 34/98]
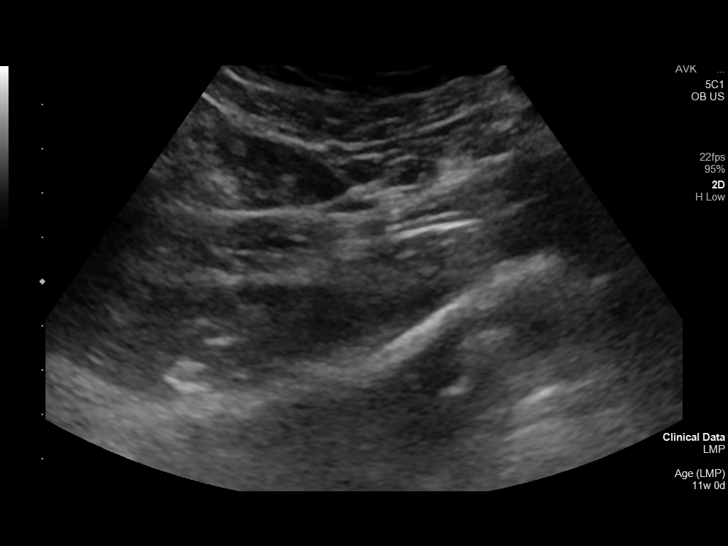
[im 42/98]
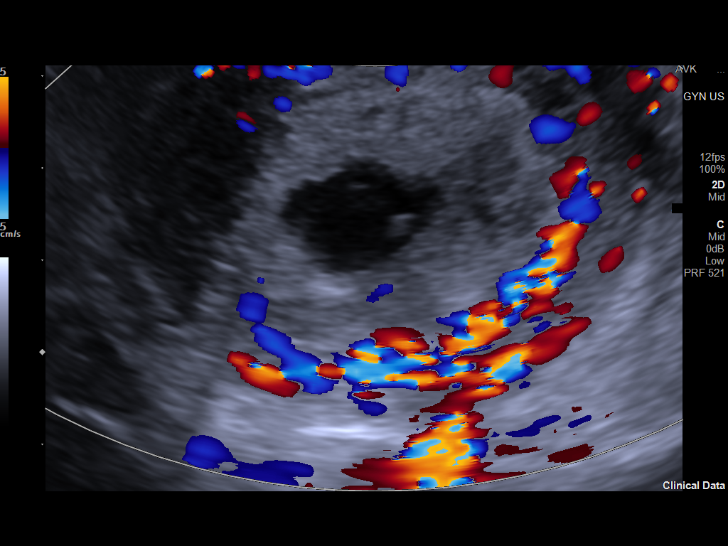
[im 53/98]
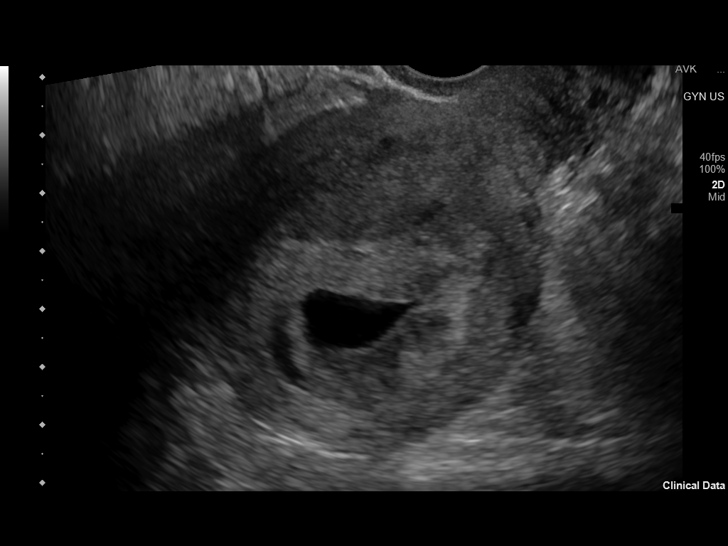
[im 60/98]
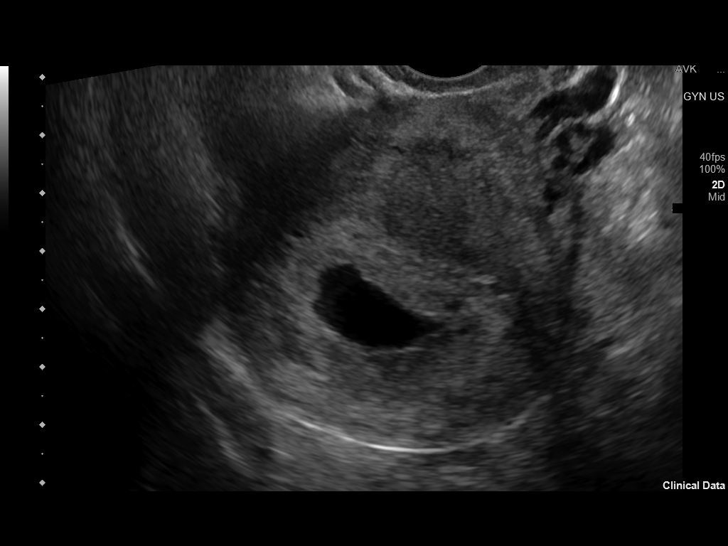
[im 68/98]
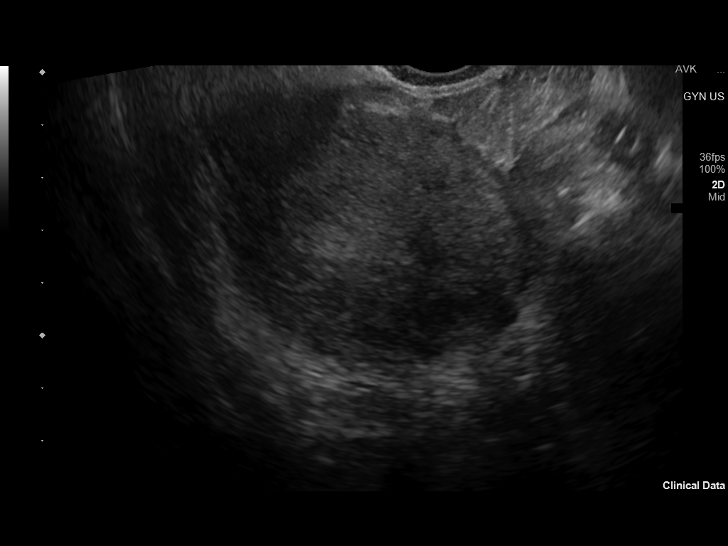
[im 75/98]
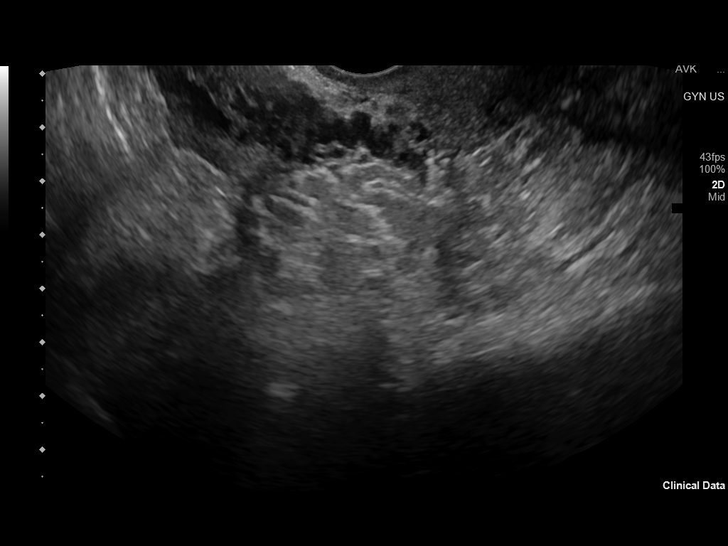
[im 83/98]
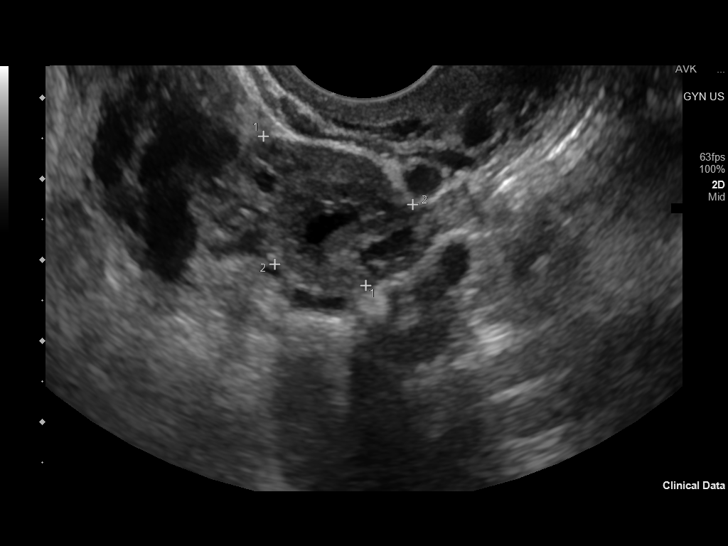
[im 90/98]
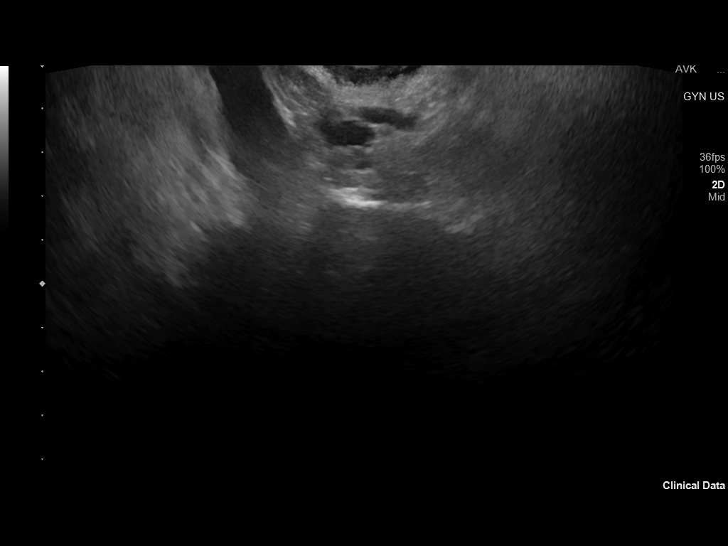
[im 98/98]
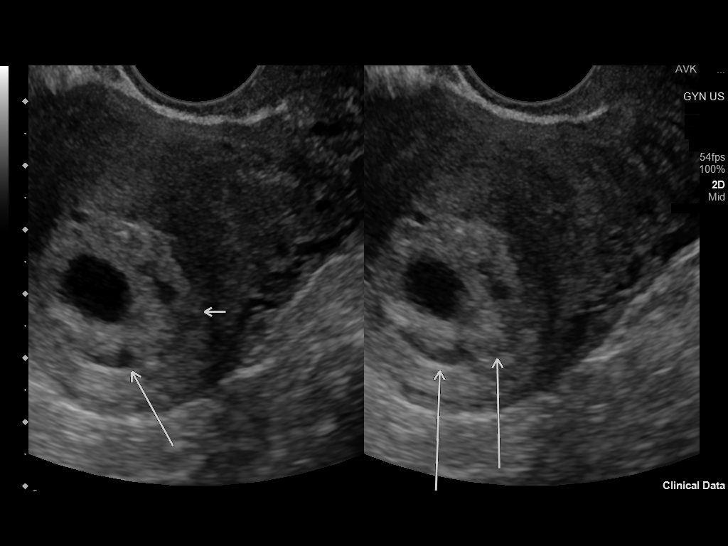

[13 of 28 positions shown; findings below may reference images not displayed]

FINDINGS: Intrauterine gestational sac: Single, mildly irregular with angular
margins

Yolk sac:  Not Visualized.

Embryo:  Visualized.

Cardiac Activity: Not Visualized.

CRL:  3.6 mm   6 w   0 d

Subchorionic hemorrhage:  Small subchronic hemorrhage.

Maternal uterus/adnexae: Right ovary is within normal limits, noting
a corpus luteum.

Left ovary is not discretely visualized.

No free fluid.
IMPRESSION: Single IUP without cardiac activity, measuring 6 weeks 0 days by
crown-rump length.

Findings are suspicious but not yet definitive for failed pregnancy.
Recommend follow-up US in 10-14 days for definitive diagnosis. This
recommendation follows SRU consensus guidelines: Diagnostic Criteria
for Nonviable Pregnancy Early in the First Trimester. N Engl J Med

## 2023-01-15 IMAGING — US US OB < 14 WEEKS - US OB TV
1 series · 13 of 28 positions shown · non-contrast
Comparison: 12/27/2020

CLINICAL DATA: Vaginal bleeding

EXAM:
OBSTETRIC <14 WK US AND TRANSVAGINAL OB US
TECHNIQUE: Both transabdominal and transvaginal ultrasound examinations were
performed for complete evaluation of the gestation as well as the
maternal uterus, adnexal regions, and pelvic cul-de-sac.
Transvaginal technique was performed to assess early pregnancy.

[Series 1: us ob less than 14 weeks with ob transvaginal · 115 acquisitions, 13 frames shown]
[im 5/115]
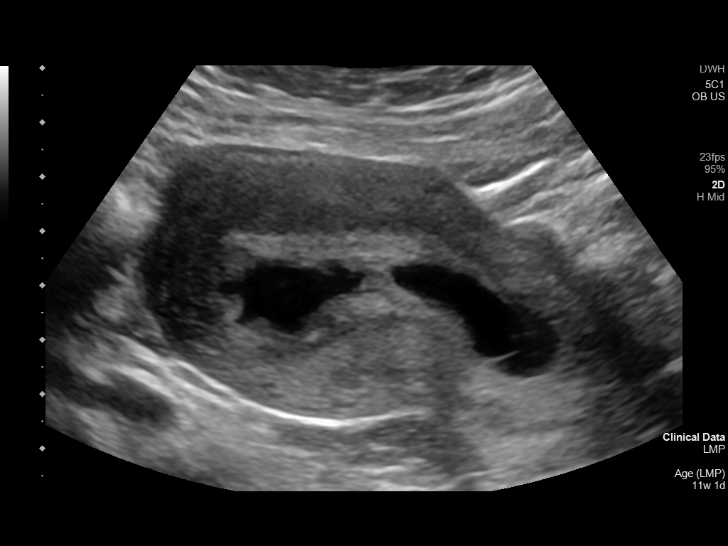
[im 13/115]
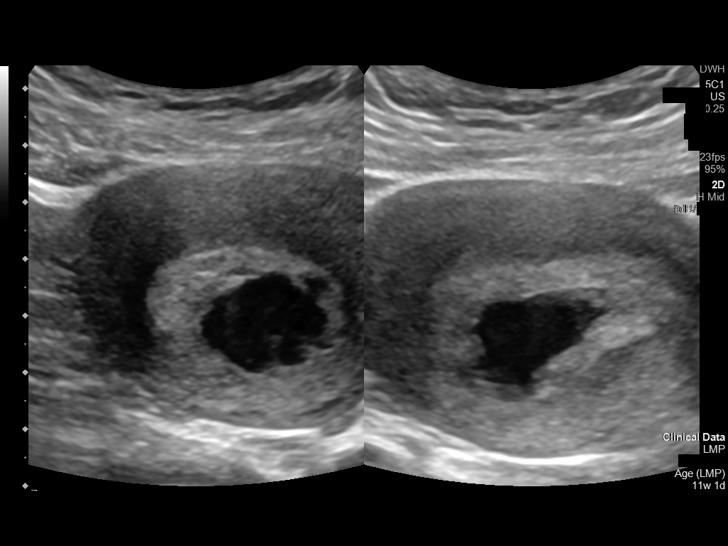
[im 22/115]
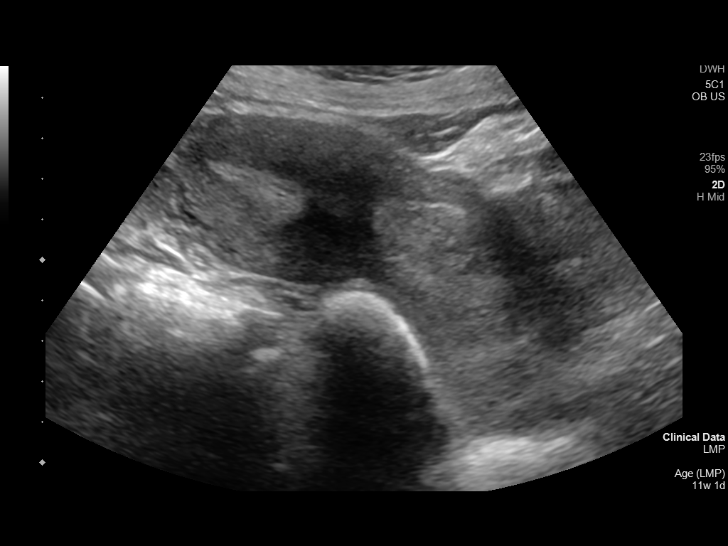
[im 30/115]
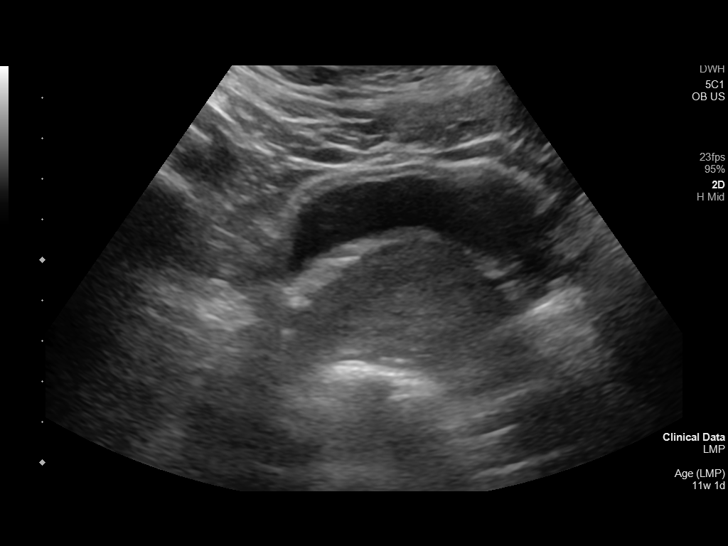
[im 39/115]
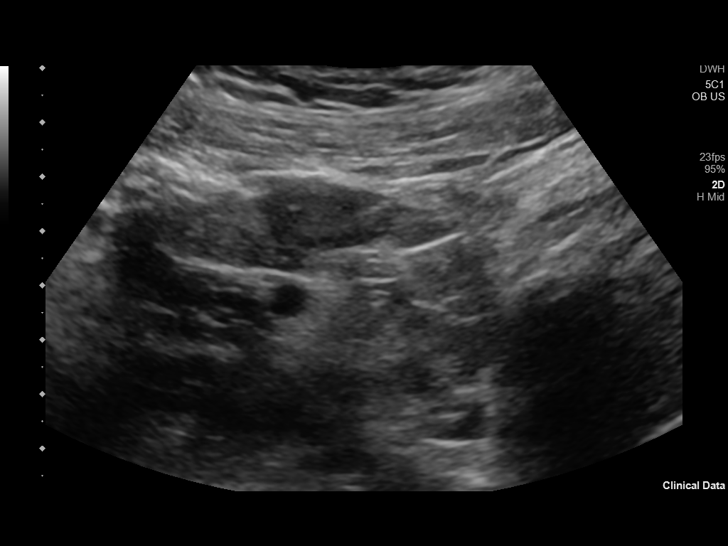
[im 47/115]
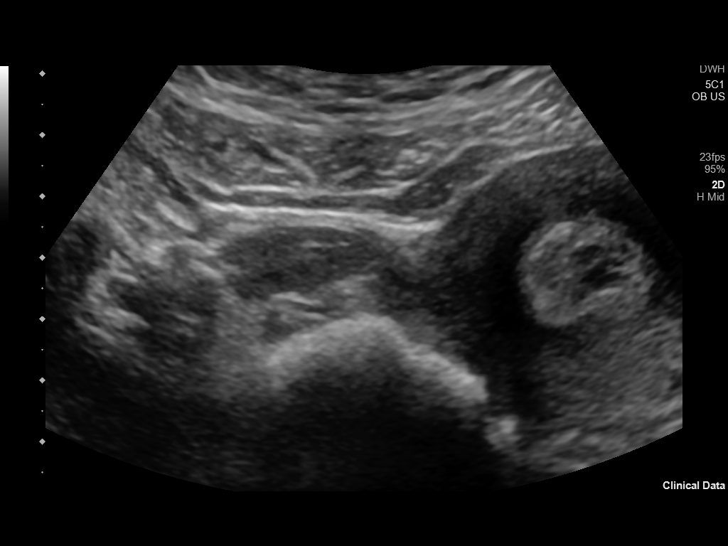
[im 60/115]
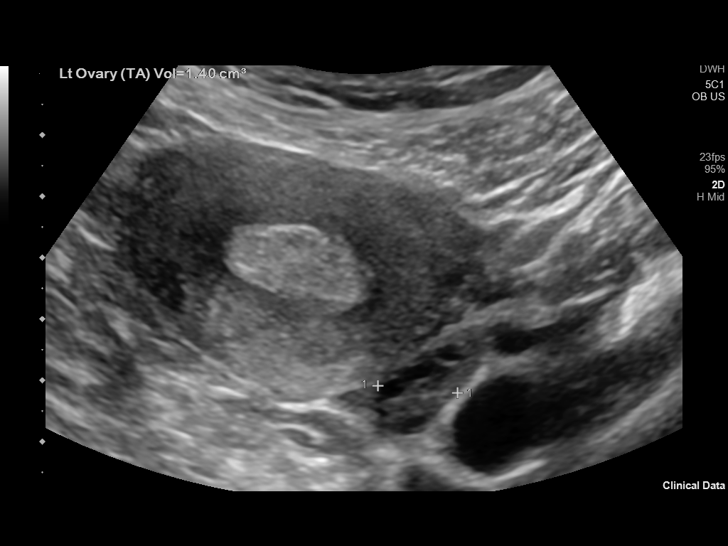
[im 68/115]
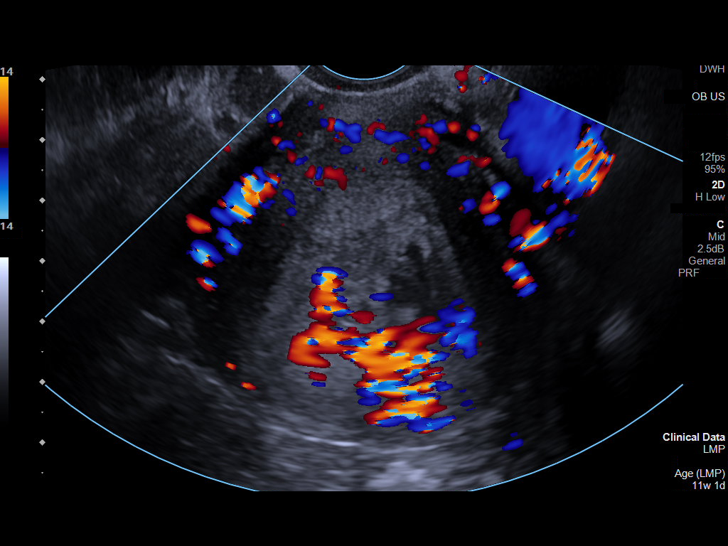
[im 77/115]
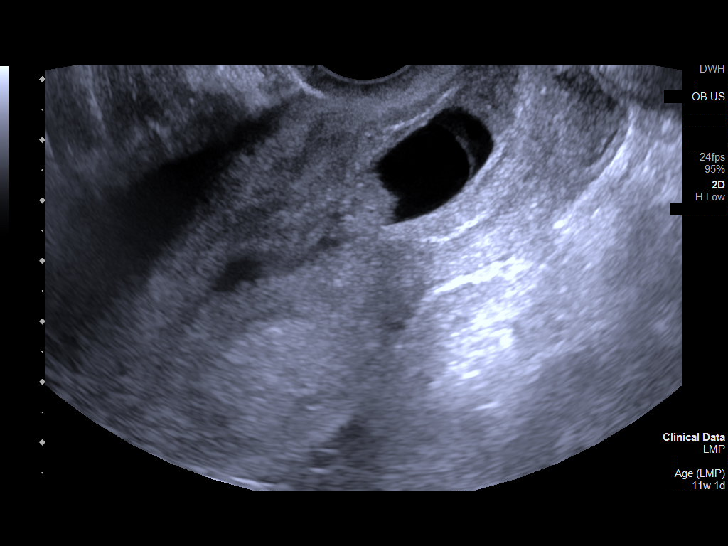
[im 85/115]
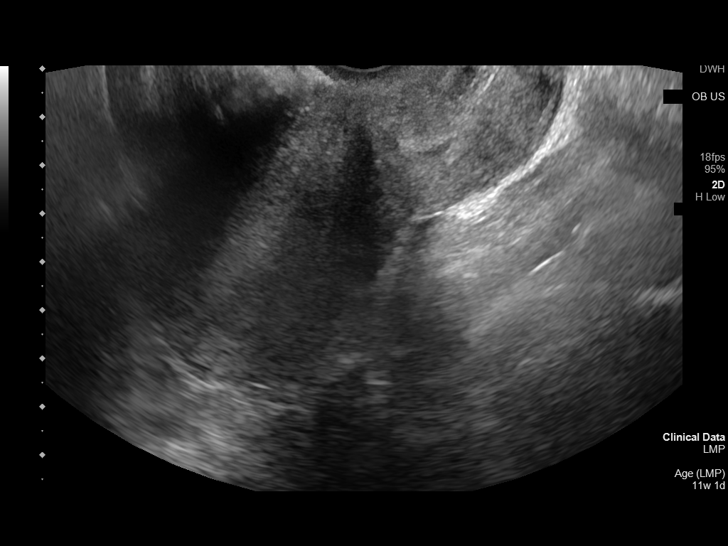
[im 93/115]
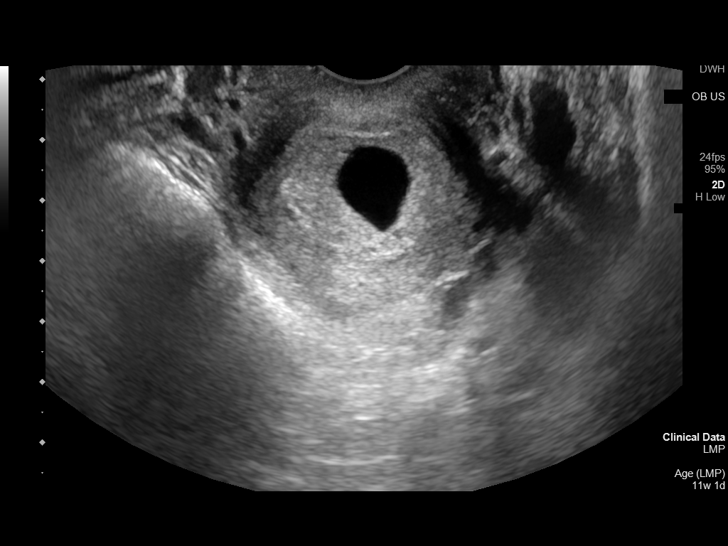
[im 102/115]
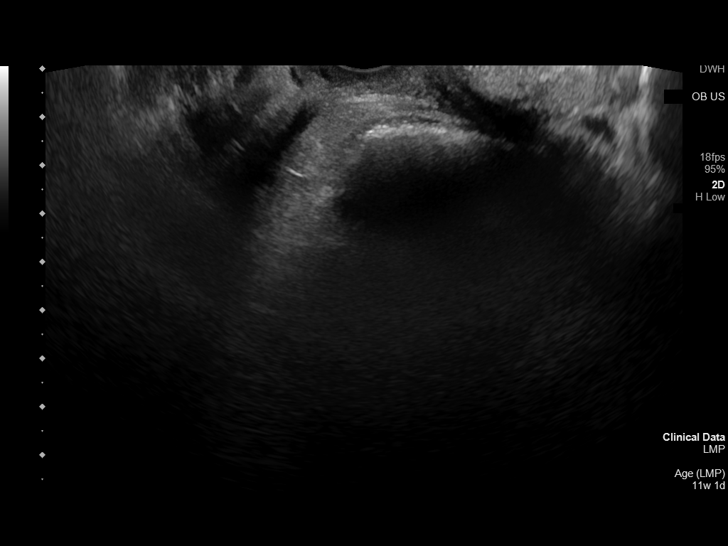
[im 110/115]
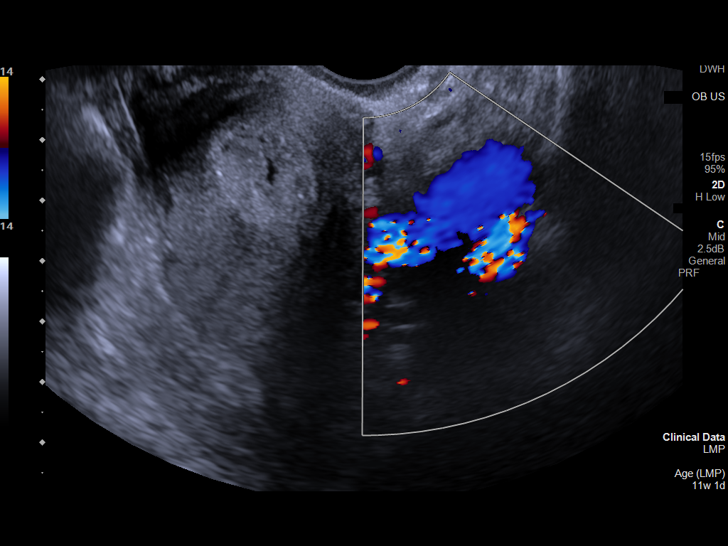

[13 of 28 positions shown; findings below may reference images not displayed]

FINDINGS: Intrauterine gestational sac: Single intrauterine gestational sac
now visualized within the lower uterine segment and upper cervix.

Yolk sac:  Not visualized

Embryo:  Not visualized

Cardiac Activity: Not visualized

MSD: 17.6 mm mm   6 w   5 d

Maternal uterus/adnexae: Ovaries are within normal limits. The left
ovary measures 2 x 1 by 1.3 cm. The right ovary measures 3.2 x
x 4.4 cm. Moderate complex material within the fundal endometrial
canal with particulate motion. Endometrial thickness measures 2.5 cm
and there is increased vascularity. Trace free fluid
IMPRESSION: 1. Elongated gestational sac now visualized within the lower uterine
segment/upper cervix, consistent with miscarriage in process.
Moderate complex material in the fundal endometrium probably
represents hemorrhagic material but follow-up sonography may be
warranted if continued concern for retained products.
2. Trace free fluid in the pelvis

## 2023-06-25 IMAGING — US US OB COMP LESS 14 WK
1 series · 14 of 28 positions shown · non-contrast
Comparison: None relevant.

CLINICAL DATA: 30-year-old female with history of 2 prior
miscarriages in the 1st trimester. Query dating. Quantitative beta
hCG not specified. Estimated gestational age by LMP 9 weeks and 6
days.

EXAM:
OBSTETRIC <14 WK ULTRASOUND
TECHNIQUE: Transabdominal ultrasound was performed for evaluation of the
gestation as well as the maternal uterus and adnexal regions.

[Series 1: us ob comp less 14 wk · 0.11mm/px · 55 acquisitions, 14 frames shown]
[im 3/55]
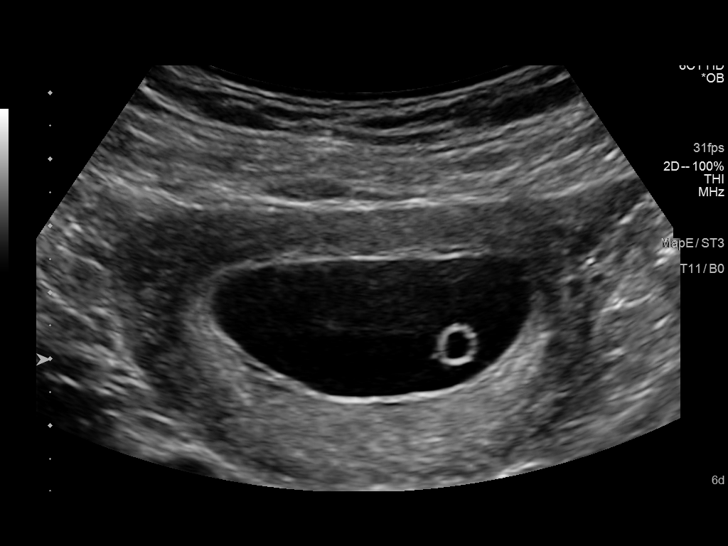
[im 7/55]
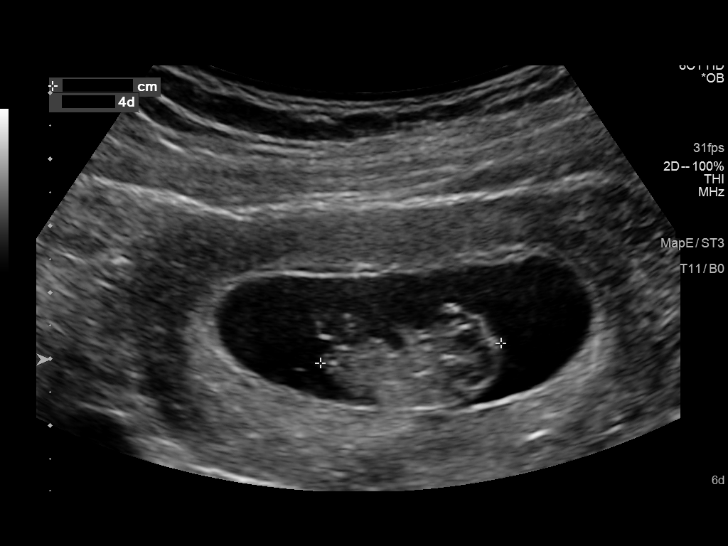
[im 11/55]
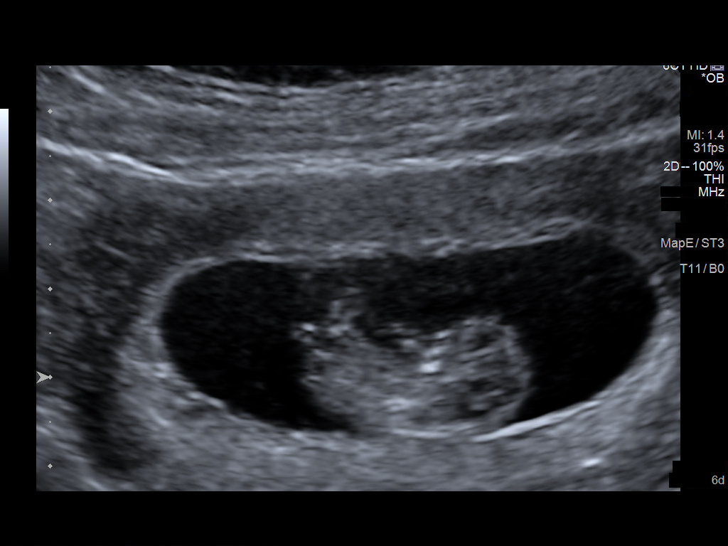
[im 15/55]
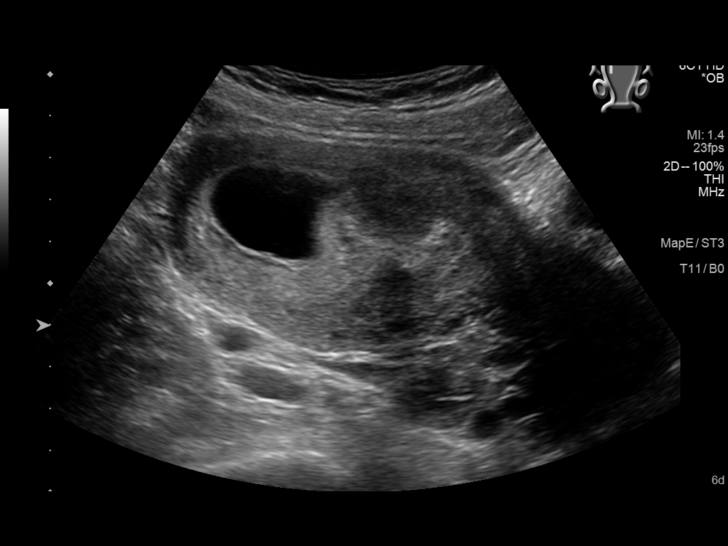
[im 19/55]
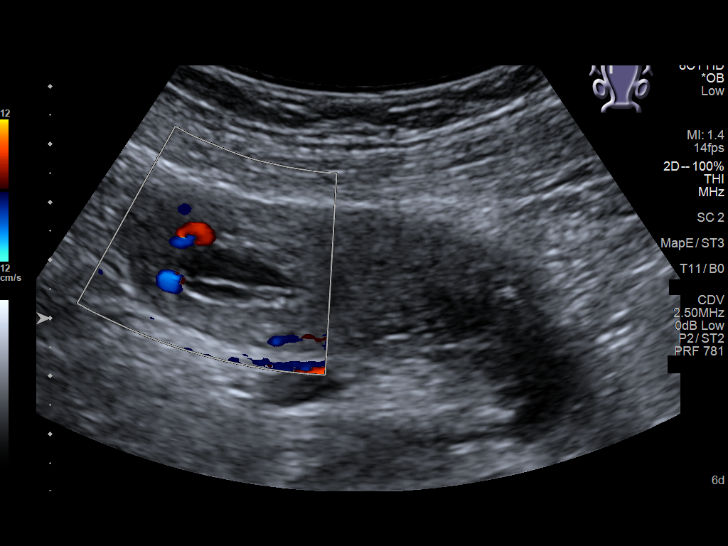
[im 23/55]
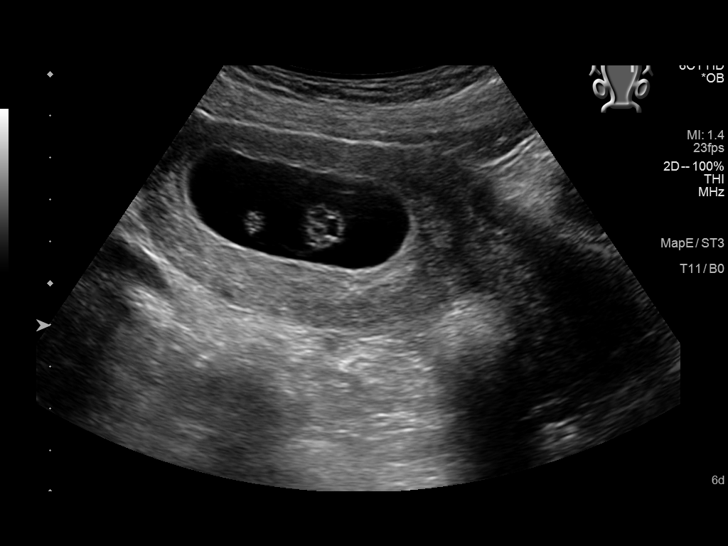
[im 27/55]
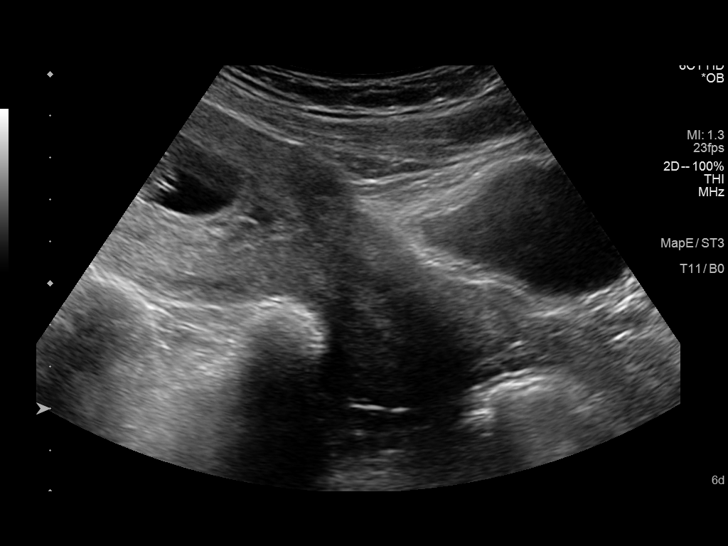
[im 31/55]
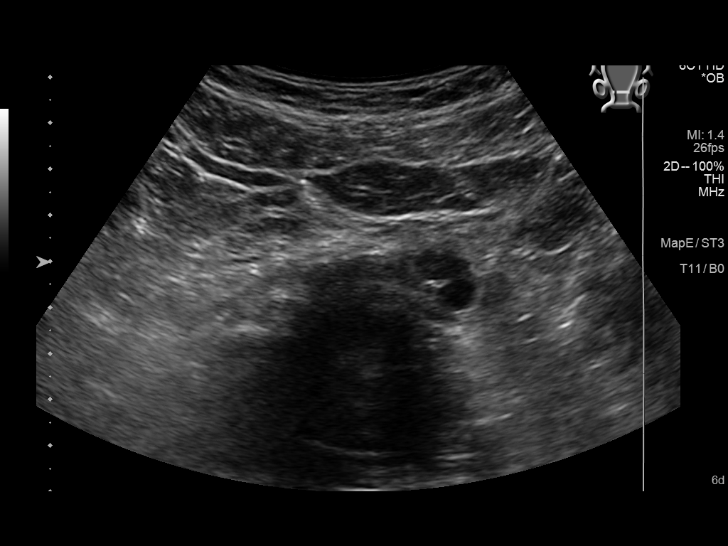
[im 35/55]
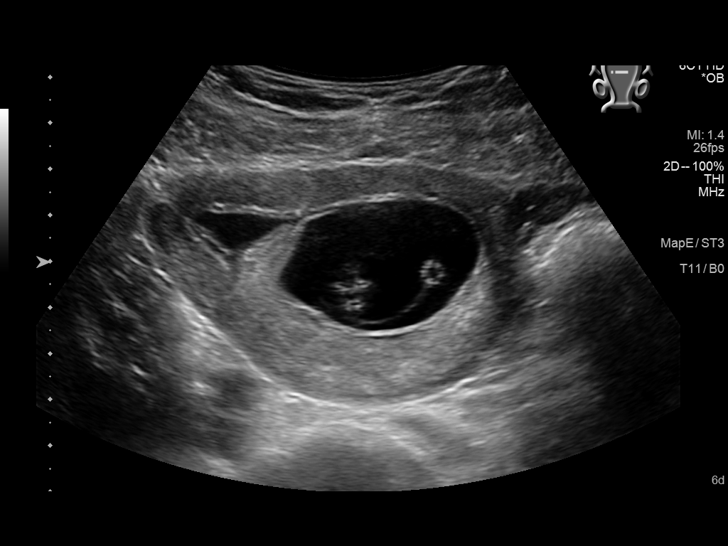
[im 39/55]
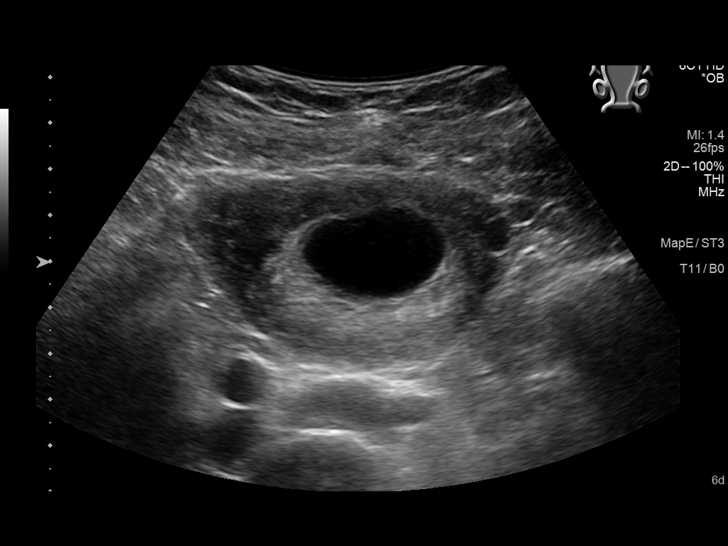
[im 43/55]
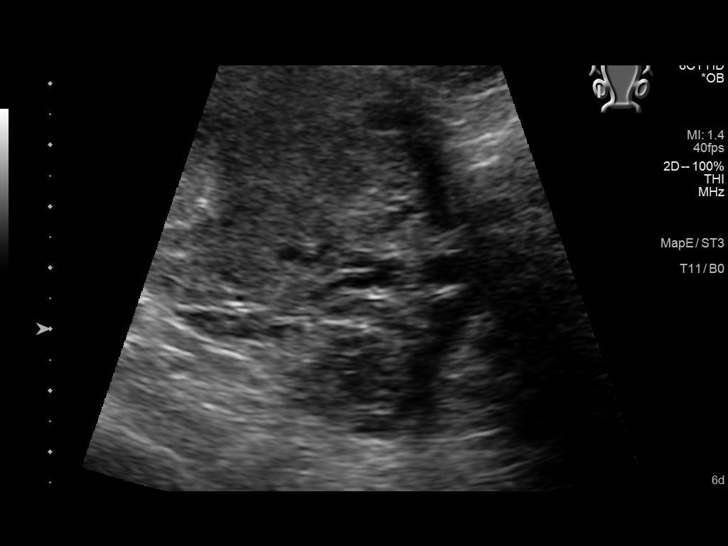
[im 47/55]
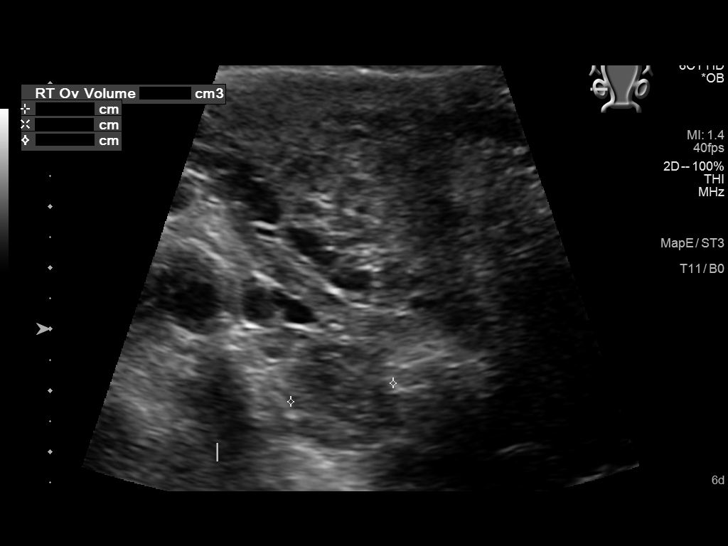
[im 51/55]
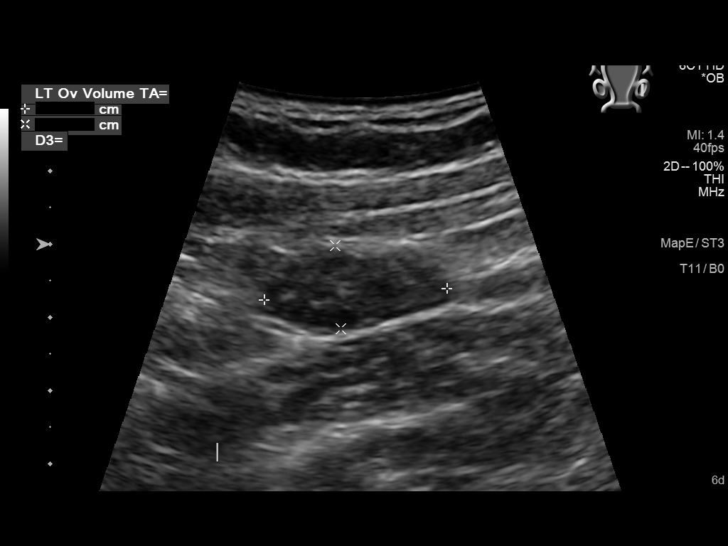
[im 55/55]
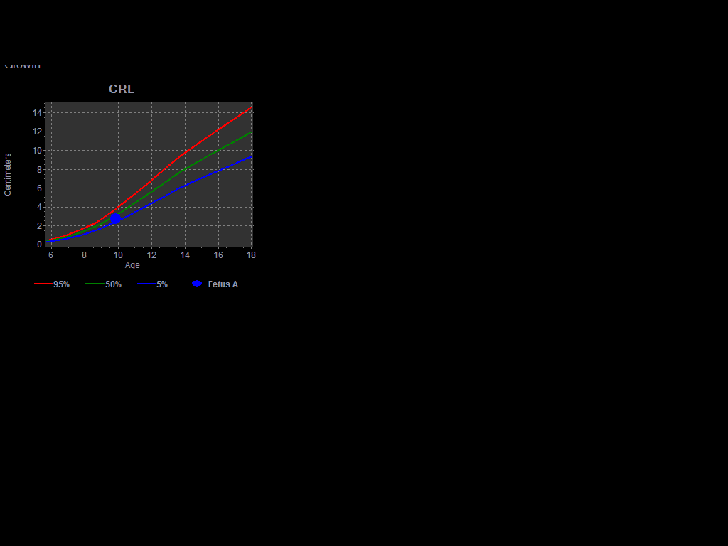

[14 of 28 positions shown; findings below may reference images not displayed]

FINDINGS: Intrauterine gestational sac: Single

Yolk sac:  Visible

Embryo:  Visible

Cardiac Activity: Detected

Heart Rate: 175 bpm

CRL:   27.2 mm   9 w 4 d                  US EDC: 01/06/2022

Subchorionic hemorrhage: Small volume suspected along the right
lateral gestational sac (image 36) about 2.2 x 1.3 x 1.6 cm.

Maternal uterus/adnexae: No pelvic free fluid. Right ovary appears
normal measuring 2.3 x 1.7 x 1.7 cm (3-4 mL). Left ovary appears
normal measuring 2.5 x 1.4 x 1.8 cm (3 mL).
IMPRESSION: Single living IUP demonstrated with estimated gestational age by
crown rump length of 9 weeks and 4 days.

Small volume subchorionic hemorrhage suspected, but no other acute
maternal findings visualized.
